# Patient Record
Sex: Male | Born: 1955 | Race: White | Hispanic: No | Marital: Married | Smoking: Current some day smoker
Health system: Southern US, Community
[De-identification: ages and names within clinical notes are randomized; demographics above are authoritative.]

## PROBLEM LIST (undated history)

## (undated) DIAGNOSIS — K219 Gastro-esophageal reflux disease without esophagitis: Secondary | ICD-10-CM

## (undated) DIAGNOSIS — R7301 Impaired fasting glucose: Secondary | ICD-10-CM

## (undated) DIAGNOSIS — R7989 Other specified abnormal findings of blood chemistry: Secondary | ICD-10-CM

## (undated) DIAGNOSIS — S39012A Strain of muscle, fascia and tendon of lower back, initial encounter: Secondary | ICD-10-CM

## (undated) DIAGNOSIS — R809 Proteinuria, unspecified: Secondary | ICD-10-CM

## (undated) DIAGNOSIS — R300 Dysuria: Secondary | ICD-10-CM

## (undated) DIAGNOSIS — I10 Essential (primary) hypertension: Secondary | ICD-10-CM

## (undated) DIAGNOSIS — R945 Abnormal results of liver function studies: Secondary | ICD-10-CM

## (undated) DIAGNOSIS — E785 Hyperlipidemia, unspecified: Secondary | ICD-10-CM

## (undated) DIAGNOSIS — K635 Polyp of colon: Secondary | ICD-10-CM

## (undated) DIAGNOSIS — R0683 Snoring: Secondary | ICD-10-CM

## (undated) DIAGNOSIS — Z720 Tobacco use: Secondary | ICD-10-CM

## (undated) DIAGNOSIS — L309 Dermatitis, unspecified: Secondary | ICD-10-CM

## (undated) DIAGNOSIS — M542 Cervicalgia: Secondary | ICD-10-CM

## (undated) DIAGNOSIS — R000 Tachycardia, unspecified: Secondary | ICD-10-CM

## (undated) DIAGNOSIS — E669 Obesity, unspecified: Secondary | ICD-10-CM

## (undated) DIAGNOSIS — M5030 Other cervical disc degeneration, unspecified cervical region: Secondary | ICD-10-CM

## (undated) DIAGNOSIS — M6530 Trigger finger, unspecified finger: Secondary | ICD-10-CM

## (undated) DIAGNOSIS — M2550 Pain in unspecified joint: Secondary | ICD-10-CM

## (undated) HISTORY — DX: Pain in unspecified joint: M25.50

## (undated) HISTORY — DX: Trigger finger, unspecified finger: M65.30

## (undated) HISTORY — DX: Impaired fasting glucose: R73.01

## (undated) HISTORY — DX: Other cervical disc degeneration, unspecified cervical region: M50.30

## (undated) HISTORY — DX: Abnormal results of liver function studies: R94.5

## (undated) HISTORY — DX: Hyperlipidemia, unspecified: E78.5

## (undated) HISTORY — DX: Snoring: R06.83

## (undated) HISTORY — PX: EYE SURGERY: SHX253

## (undated) HISTORY — DX: Dysuria: R30.0

## (undated) HISTORY — DX: Dermatitis, unspecified: L30.9

## (undated) HISTORY — DX: Tachycardia, unspecified: R00.0

## (undated) HISTORY — DX: Tobacco use: Z72.0

## (undated) HISTORY — DX: Other specified abnormal findings of blood chemistry: R79.89

## (undated) HISTORY — PX: OTHER SURGICAL HISTORY: SHX169

## (undated) HISTORY — DX: Gastro-esophageal reflux disease without esophagitis: K21.9

## (undated) HISTORY — DX: Polyp of colon: K63.5

## (undated) HISTORY — DX: Proteinuria, unspecified: R80.9

## (undated) HISTORY — DX: Cervicalgia: M54.2

## (undated) HISTORY — DX: Essential (primary) hypertension: I10

## (undated) HISTORY — DX: Obesity, unspecified: E66.9

## (undated) HISTORY — DX: Strain of muscle, fascia and tendon of lower back, initial encounter: S39.012A

---

## 1992-08-30 HISTORY — PX: LUMBAR LAMINECTOMY: SHX95

## 1999-06-17 ENCOUNTER — Encounter: Admission: RE | Admit: 1999-06-17 | Discharge: 1999-06-17 | Payer: Self-pay | Admitting: *Deleted

## 1999-06-17 ENCOUNTER — Encounter: Payer: Self-pay | Admitting: *Deleted

## 1999-10-08 ENCOUNTER — Ambulatory Visit (HOSPITAL_BASED_OUTPATIENT_CLINIC_OR_DEPARTMENT_OTHER): Admission: RE | Admit: 1999-10-08 | Discharge: 1999-10-08 | Payer: Self-pay | Admitting: Orthopedic Surgery

## 2004-11-11 ENCOUNTER — Ambulatory Visit: Payer: Self-pay | Admitting: Cardiology

## 2004-11-11 ENCOUNTER — Observation Stay (HOSPITAL_COMMUNITY): Admission: EM | Admit: 2004-11-11 | Discharge: 2004-11-12 | Payer: Self-pay | Admitting: Emergency Medicine

## 2004-11-12 ENCOUNTER — Ambulatory Visit: Payer: Self-pay | Admitting: Cardiology

## 2004-11-12 ENCOUNTER — Ambulatory Visit: Payer: Self-pay

## 2015-01-23 ENCOUNTER — Other Ambulatory Visit: Payer: Self-pay | Admitting: Internal Medicine

## 2015-01-23 DIAGNOSIS — Z720 Tobacco use: Secondary | ICD-10-CM

## 2016-01-13 DIAGNOSIS — H2511 Age-related nuclear cataract, right eye: Secondary | ICD-10-CM | POA: Diagnosis not present

## 2016-01-14 DIAGNOSIS — H2512 Age-related nuclear cataract, left eye: Secondary | ICD-10-CM | POA: Diagnosis not present

## 2016-01-15 DIAGNOSIS — Z0000 Encounter for general adult medical examination without abnormal findings: Secondary | ICD-10-CM | POA: Diagnosis not present

## 2016-01-15 DIAGNOSIS — Z125 Encounter for screening for malignant neoplasm of prostate: Secondary | ICD-10-CM | POA: Diagnosis not present

## 2016-01-15 DIAGNOSIS — R7301 Impaired fasting glucose: Secondary | ICD-10-CM | POA: Diagnosis not present

## 2016-01-15 DIAGNOSIS — E784 Other hyperlipidemia: Secondary | ICD-10-CM | POA: Diagnosis not present

## 2016-01-22 DIAGNOSIS — D126 Benign neoplasm of colon, unspecified: Secondary | ICD-10-CM | POA: Diagnosis not present

## 2016-01-22 DIAGNOSIS — R030 Elevated blood-pressure reading, without diagnosis of hypertension: Secondary | ICD-10-CM | POA: Diagnosis not present

## 2016-01-22 DIAGNOSIS — Z1389 Encounter for screening for other disorder: Secondary | ICD-10-CM | POA: Diagnosis not present

## 2016-01-22 DIAGNOSIS — Z0000 Encounter for general adult medical examination without abnormal findings: Secondary | ICD-10-CM | POA: Diagnosis not present

## 2016-01-27 DIAGNOSIS — H2512 Age-related nuclear cataract, left eye: Secondary | ICD-10-CM | POA: Diagnosis not present

## 2016-02-23 DIAGNOSIS — M9901 Segmental and somatic dysfunction of cervical region: Secondary | ICD-10-CM | POA: Diagnosis not present

## 2016-02-23 DIAGNOSIS — M5032 Other cervical disc degeneration, mid-cervical region, unspecified level: Secondary | ICD-10-CM | POA: Diagnosis not present

## 2016-02-23 DIAGNOSIS — M9902 Segmental and somatic dysfunction of thoracic region: Secondary | ICD-10-CM | POA: Diagnosis not present

## 2016-02-23 DIAGNOSIS — M531 Cervicobrachial syndrome: Secondary | ICD-10-CM | POA: Diagnosis not present

## 2016-03-01 DIAGNOSIS — H5005 Alternating esotropia: Secondary | ICD-10-CM | POA: Diagnosis not present

## 2016-06-14 DIAGNOSIS — M531 Cervicobrachial syndrome: Secondary | ICD-10-CM | POA: Diagnosis not present

## 2016-06-14 DIAGNOSIS — M9901 Segmental and somatic dysfunction of cervical region: Secondary | ICD-10-CM | POA: Diagnosis not present

## 2016-06-14 DIAGNOSIS — M9902 Segmental and somatic dysfunction of thoracic region: Secondary | ICD-10-CM | POA: Diagnosis not present

## 2016-06-14 DIAGNOSIS — M5032 Other cervical disc degeneration, mid-cervical region, unspecified level: Secondary | ICD-10-CM | POA: Diagnosis not present

## 2016-06-16 DIAGNOSIS — M5032 Other cervical disc degeneration, mid-cervical region, unspecified level: Secondary | ICD-10-CM | POA: Diagnosis not present

## 2016-06-16 DIAGNOSIS — M531 Cervicobrachial syndrome: Secondary | ICD-10-CM | POA: Diagnosis not present

## 2016-06-16 DIAGNOSIS — M9901 Segmental and somatic dysfunction of cervical region: Secondary | ICD-10-CM | POA: Diagnosis not present

## 2016-06-16 DIAGNOSIS — M9902 Segmental and somatic dysfunction of thoracic region: Secondary | ICD-10-CM | POA: Diagnosis not present

## 2016-07-01 DIAGNOSIS — D485 Neoplasm of uncertain behavior of skin: Secondary | ICD-10-CM | POA: Diagnosis not present

## 2016-07-01 DIAGNOSIS — M6748 Ganglion, other site: Secondary | ICD-10-CM | POA: Diagnosis not present

## 2016-07-01 DIAGNOSIS — L820 Inflamed seborrheic keratosis: Secondary | ICD-10-CM | POA: Diagnosis not present

## 2016-07-07 DIAGNOSIS — M6740 Ganglion, unspecified site: Secondary | ICD-10-CM | POA: Diagnosis not present

## 2016-07-07 DIAGNOSIS — M24542 Contracture, left hand: Secondary | ICD-10-CM | POA: Diagnosis not present

## 2017-02-16 DIAGNOSIS — R808 Other proteinuria: Secondary | ICD-10-CM | POA: Diagnosis not present

## 2017-02-16 DIAGNOSIS — E784 Other hyperlipidemia: Secondary | ICD-10-CM | POA: Diagnosis not present

## 2017-02-16 DIAGNOSIS — Z125 Encounter for screening for malignant neoplasm of prostate: Secondary | ICD-10-CM | POA: Diagnosis not present

## 2017-02-16 DIAGNOSIS — R7301 Impaired fasting glucose: Secondary | ICD-10-CM | POA: Diagnosis not present

## 2017-02-23 DIAGNOSIS — E668 Other obesity: Secondary | ICD-10-CM | POA: Diagnosis not present

## 2017-02-23 DIAGNOSIS — L0292 Furuncle, unspecified: Secondary | ICD-10-CM | POA: Diagnosis not present

## 2017-02-23 DIAGNOSIS — R300 Dysuria: Secondary | ICD-10-CM | POA: Diagnosis not present

## 2017-02-23 DIAGNOSIS — E784 Other hyperlipidemia: Secondary | ICD-10-CM | POA: Diagnosis not present

## 2017-02-23 DIAGNOSIS — Z0000 Encounter for general adult medical examination without abnormal findings: Secondary | ICD-10-CM | POA: Diagnosis not present

## 2017-02-23 DIAGNOSIS — Z1389 Encounter for screening for other disorder: Secondary | ICD-10-CM | POA: Diagnosis not present

## 2017-02-23 DIAGNOSIS — R000 Tachycardia, unspecified: Secondary | ICD-10-CM | POA: Diagnosis not present

## 2017-02-23 DIAGNOSIS — M542 Cervicalgia: Secondary | ICD-10-CM | POA: Diagnosis not present

## 2017-02-24 DIAGNOSIS — Z1212 Encounter for screening for malignant neoplasm of rectum: Secondary | ICD-10-CM | POA: Diagnosis not present

## 2017-06-22 DIAGNOSIS — M79642 Pain in left hand: Secondary | ICD-10-CM | POA: Insufficient documentation

## 2017-06-22 DIAGNOSIS — M65312 Trigger thumb, left thumb: Secondary | ICD-10-CM | POA: Insufficient documentation

## 2017-06-22 DIAGNOSIS — S66812A Strain of other specified muscles, fascia and tendons at wrist and hand level, left hand, initial encounter: Secondary | ICD-10-CM | POA: Insufficient documentation

## 2017-11-23 DIAGNOSIS — H26491 Other secondary cataract, right eye: Secondary | ICD-10-CM | POA: Diagnosis not present

## 2017-11-30 DIAGNOSIS — H26492 Other secondary cataract, left eye: Secondary | ICD-10-CM | POA: Diagnosis not present

## 2018-03-31 DIAGNOSIS — R7301 Impaired fasting glucose: Secondary | ICD-10-CM | POA: Diagnosis not present

## 2018-03-31 DIAGNOSIS — R82998 Other abnormal findings in urine: Secondary | ICD-10-CM | POA: Diagnosis not present

## 2018-03-31 DIAGNOSIS — Z0000 Encounter for general adult medical examination without abnormal findings: Secondary | ICD-10-CM | POA: Diagnosis not present

## 2018-03-31 DIAGNOSIS — Z125 Encounter for screening for malignant neoplasm of prostate: Secondary | ICD-10-CM | POA: Diagnosis not present

## 2018-03-31 DIAGNOSIS — E7849 Other hyperlipidemia: Secondary | ICD-10-CM | POA: Diagnosis not present

## 2018-03-31 DIAGNOSIS — R808 Other proteinuria: Secondary | ICD-10-CM | POA: Diagnosis not present

## 2018-04-07 DIAGNOSIS — Z0000 Encounter for general adult medical examination without abnormal findings: Secondary | ICD-10-CM | POA: Diagnosis not present

## 2018-04-07 DIAGNOSIS — Z1389 Encounter for screening for other disorder: Secondary | ICD-10-CM | POA: Diagnosis not present

## 2018-04-07 DIAGNOSIS — Z6830 Body mass index (BMI) 30.0-30.9, adult: Secondary | ICD-10-CM | POA: Diagnosis not present

## 2018-05-10 DIAGNOSIS — R030 Elevated blood-pressure reading, without diagnosis of hypertension: Secondary | ICD-10-CM | POA: Diagnosis not present

## 2018-05-10 DIAGNOSIS — Z6830 Body mass index (BMI) 30.0-30.9, adult: Secondary | ICD-10-CM | POA: Diagnosis not present

## 2018-05-10 DIAGNOSIS — T148XXA Other injury of unspecified body region, initial encounter: Secondary | ICD-10-CM | POA: Diagnosis not present

## 2018-10-02 DIAGNOSIS — H43391 Other vitreous opacities, right eye: Secondary | ICD-10-CM | POA: Diagnosis not present

## 2018-10-02 DIAGNOSIS — Z961 Presence of intraocular lens: Secondary | ICD-10-CM | POA: Diagnosis not present

## 2018-10-02 DIAGNOSIS — H43393 Other vitreous opacities, bilateral: Secondary | ICD-10-CM | POA: Diagnosis not present

## 2019-01-15 DIAGNOSIS — M180 Bilateral primary osteoarthritis of first carpometacarpal joints: Secondary | ICD-10-CM | POA: Diagnosis not present

## 2019-01-15 DIAGNOSIS — M79642 Pain in left hand: Secondary | ICD-10-CM | POA: Diagnosis not present

## 2019-01-15 DIAGNOSIS — M65332 Trigger finger, left middle finger: Secondary | ICD-10-CM | POA: Diagnosis not present

## 2019-01-15 DIAGNOSIS — M79641 Pain in right hand: Secondary | ICD-10-CM | POA: Diagnosis not present

## 2019-02-10 DIAGNOSIS — Z20828 Contact with and (suspected) exposure to other viral communicable diseases: Secondary | ICD-10-CM | POA: Diagnosis not present

## 2019-03-11 DIAGNOSIS — I639 Cerebral infarction, unspecified: Secondary | ICD-10-CM | POA: Diagnosis not present

## 2019-03-11 DIAGNOSIS — Z7982 Long term (current) use of aspirin: Secondary | ICD-10-CM | POA: Diagnosis not present

## 2019-03-11 DIAGNOSIS — R200 Anesthesia of skin: Secondary | ICD-10-CM | POA: Diagnosis not present

## 2019-03-11 DIAGNOSIS — F1010 Alcohol abuse, uncomplicated: Secondary | ICD-10-CM | POA: Insufficient documentation

## 2019-03-11 DIAGNOSIS — R42 Dizziness and giddiness: Secondary | ICD-10-CM | POA: Diagnosis not present

## 2019-03-11 DIAGNOSIS — I5189 Other ill-defined heart diseases: Secondary | ICD-10-CM | POA: Diagnosis not present

## 2019-03-11 DIAGNOSIS — I10 Essential (primary) hypertension: Secondary | ICD-10-CM | POA: Insufficient documentation

## 2019-03-11 DIAGNOSIS — F17210 Nicotine dependence, cigarettes, uncomplicated: Secondary | ICD-10-CM | POA: Diagnosis not present

## 2019-03-11 DIAGNOSIS — R202 Paresthesia of skin: Secondary | ICD-10-CM | POA: Diagnosis not present

## 2019-03-11 DIAGNOSIS — Z720 Tobacco use: Secondary | ICD-10-CM | POA: Insufficient documentation

## 2019-03-12 DIAGNOSIS — G459 Transient cerebral ischemic attack, unspecified: Secondary | ICD-10-CM | POA: Diagnosis not present

## 2019-03-12 DIAGNOSIS — I639 Cerebral infarction, unspecified: Secondary | ICD-10-CM | POA: Insufficient documentation

## 2019-03-14 DIAGNOSIS — F17200 Nicotine dependence, unspecified, uncomplicated: Secondary | ICD-10-CM | POA: Diagnosis not present

## 2019-03-14 DIAGNOSIS — I639 Cerebral infarction, unspecified: Secondary | ICD-10-CM | POA: Diagnosis not present

## 2019-03-14 DIAGNOSIS — I10 Essential (primary) hypertension: Secondary | ICD-10-CM | POA: Diagnosis not present

## 2019-03-14 DIAGNOSIS — E785 Hyperlipidemia, unspecified: Secondary | ICD-10-CM | POA: Diagnosis not present

## 2019-03-21 DIAGNOSIS — I639 Cerebral infarction, unspecified: Secondary | ICD-10-CM | POA: Diagnosis not present

## 2019-03-21 DIAGNOSIS — I10 Essential (primary) hypertension: Secondary | ICD-10-CM | POA: Diagnosis not present

## 2019-03-21 DIAGNOSIS — M5412 Radiculopathy, cervical region: Secondary | ICD-10-CM | POA: Diagnosis not present

## 2019-03-23 ENCOUNTER — Other Ambulatory Visit: Payer: Self-pay | Admitting: Internal Medicine

## 2019-03-23 DIAGNOSIS — I6381 Other cerebral infarction due to occlusion or stenosis of small artery: Secondary | ICD-10-CM

## 2019-03-27 ENCOUNTER — Ambulatory Visit
Admission: RE | Admit: 2019-03-27 | Discharge: 2019-03-27 | Disposition: A | Discharge status: Home / Self Care | Payer: BC Managed Care – PPO | Source: Ambulatory Visit | Attending: Internal Medicine | Admitting: Internal Medicine

## 2019-03-27 ENCOUNTER — Other Ambulatory Visit: Payer: Self-pay

## 2019-03-27 ENCOUNTER — Encounter: Payer: Self-pay | Admitting: Neurology

## 2019-03-27 ENCOUNTER — Ambulatory Visit: Payer: BC Managed Care – PPO | Admitting: Neurology

## 2019-03-27 VITALS — BP 160/95 | HR 77 | Temp 98.2°F | Ht 71.0 in | Wt 215.6 lb

## 2019-03-27 DIAGNOSIS — I6381 Other cerebral infarction due to occlusion or stenosis of small artery: Secondary | ICD-10-CM | POA: Diagnosis not present

## 2019-03-27 DIAGNOSIS — R202 Paresthesia of skin: Secondary | ICD-10-CM

## 2019-03-27 DIAGNOSIS — M542 Cervicalgia: Secondary | ICD-10-CM

## 2019-03-27 MED ORDER — CO-ENZYME Q-10 50 MG PO CAPS: 200.0000 mg | ORAL_CAPSULE | Freq: Every day | ORAL | 0 refills | Status: DC

## 2019-03-27 NOTE — Progress Notes (Signed)
[facility] [address] [place]. [place] [id number] [phone]       OFFICE CONSULT NOTE  [name] Date of Birth:  02/18/1956 Medical Record Number:  [id number]   Referring MD: Rodrigo RanMark Perini Reason for Referral: Stroke HPI: [name] is a 63 year old Caucasian male seen today for initial office consultation visit for stroke.  History is obtained from the patient, review of referral records and I personally reviewed imaging films in PACS.  Patient states [place] 8 when he developed sudden onset of numbness and heaviness in his left arm.  He did not think much of it as he could drive.  Next day felt better but when symptoms recurred he eventually called his physician who asked him to go to the emergency room on July 12 at [facility] [place].  He was admitted there for suspected stroke work-up.  CT scan showed a right basal ganglia 9 mm low-density thought to be lacunar infarct.  CT angiogram of the brain and neck was obtained which showed no significant large vessel intracranial or extracranial stenosis or occlusion.  MRI scan was obtained which showed no definite evidence of acute infarct.  Review of imaging films in PACS show questionable right parietal punctate diffusion hyperintensity however there is no corresponding images available in ADC map to see if this is acute or not.  The patient symptoms have persisted intermittently since then though they are getting better.  He has not had any numbness in his left leg.  He denied any accompanying symptoms in the form of headache, slurred speech, extremity weakness, gait or balance difficulties.  He has no prior history of strokes TIAs seizures migraines or significant neurological problems.  The patient was started on aspirin 81 mg as well as lisinopril for hypertension and fluvastatin.  Is tolerating these medications well without side effects but does  complain of muscle aches and pains and he does have a prior history of statin myalgias.  He on inquiry admits to having neck pain requiring chiropractic manipulation several times in the past.  He did complain of some neck pain and pain radiating down his left shoulder while he was [place] [facility] [place] [facility].  He has no family history of strokes or TIAs.  ROS:   14 system review of systems is positive for numbness, muscle aches, joint pain, fatigue, tiredness, neck pain all other systems negative PMH:  Past Medical History:  Diagnosis Date  . Back strain   . Colonic polyp   . DDD (degenerative disc disease), cervical   . Dysuria   . GERD (gastroesophageal reflux disease)   . Hyperlipidemia   . Neck pain   . Pain in joint, multiple sites   . Snoring   . Trigger finger   . White coat syndrome with hypertension     Social History:  Social History   Socioeconomic History  . Marital status: Married    Spouse name: Not on file  . Number of children: Not on file  . Years of education: Not on file  . Highest education level: Not on file  Occupational History  . Not on file  Social Needs  . Financial resource strain: Not on file  . Food insecurity    Worry: Not on file    Inability: Not on file  . Transportation needs    Medical: Not on file    Non-medical:  Not on file  Tobacco Use  . Smoking status: Current Every Day Smoker    Packs/day: 3.00    Types: Cigarettes  . Smokeless tobacco: Never Used  Substance and Sexual Activity  . Alcohol use: Yes    Alcohol/week: 2.0 standard drinks    Types: 2 Glasses of wine per week  . Drug use: Not Currently  . Sexual activity: Not on file  Lifestyle  . Physical activity    Days per week: Not on file    Minutes per session: Not on file  . Stress: Not on file  Relationships  . Social Musicianconnections    Talks on phone: Not on file    Gets together: Not on file    Attends religious service: Not on file    Active member of  club or organization: Not on file    Attends meetings of clubs or organizations: Not on file    Relationship status: Not on file  . Intimate partner violence    Fear of current or ex partner: Not on file    Emotionally abused: Not on file    Physically abused: Not on file    Forced sexual activity: Not on file  Other Topics Concern  . Not on file  Social History Narrative  . Not on file    Medications:   No current outpatient medications on file prior to visit.   No current facility-administered medications on file prior to visit.     Allergies:  Not on File  Physical Exam General: Overweight middle-aged Caucasian male, seated, in no evident distress Head: head normocephalic and atraumatic.   Neck: supple with no carotid or supraclavicular bruits Cardiovascular: regular rate and rhythm, no murmurs Musculoskeletal: no deformity Skin:  no rash/petichiae Vascular:  Normal pulses all extremities  Neurologic Exam Mental Status: Awake and fully alert. Oriented to place and time. Recent and remote memory intact. Attention span, concentration and fund of knowledge appropriate. Mood and affect appropriate.  Cranial Nerves: Fundoscopic exam reveals sharp disc margins. Pupils equal, briskly reactive to light. Extraocular movements full without nystagmus. Visual fields full to confrontation. Hearing intact. Facial sensation intact. Face, tongue, palate moves normally and symmetrically.  Motor: Normal bulk and tone. Normal strength in all tested extremity muscles. Sensory.: intact to touch , pinprick , position and vibratory sensation.  Subjective paresthesia in the left arm but no objective sensory loss Coordination: Rapid alternating movements normal in all extremities. Finger-to-nose and heel-to-shin performed accurately bilaterally. Gait and Station: Arises from chair without difficulty. Stance is normal. Gait demonstrates normal stride length and balance . Able to heel, toe and tandem  walk with slight difficulty.  Reflexes: 1+ and symmetric. Toes downgoing.   NIHSS  0 Modified Rankin  2   ASSESSMENT: 63 year old Caucasian male with transient left arm and leg paresthesias in July 2020 possibly from small right brain lacunar infarct not visualized on MRI x2.  Vascular risk factors of smoking, mild obesity, hypertension and hyperlipidemia   PLAN:  I had a long d/w patient about his recurrent transient left arm and leg paresthesias likely being from small lacunar infarct not visualized on MRI brain x2.  He however also has neck pain and compressive cervical spine disease is also consideration.  I discussed with him risk for recurrent stroke/TIAs, personally independently reviewed imaging studies and stroke evaluation results and answered questions.Continue aspirin 81 mg daily  for secondary stroke prevention and maintain strict control of hypertension with blood pressure goal below 130/90, diabetes  with hemoglobin A1c goal below 6.5% and lipids with LDL cholesterol goal below 70 mg/dL. I also advised the patient to eat a healthy diet with plenty of whole grains, cereals, fruits and vegetables, exercise regularly and maintain ideal body weight .he was advised to keep his appointment for scheduled MRI scan of the cervical spine and I will also order EMG nerve conduction study.  Advised him to take coenzyme Q 10 200 mg daily to help with his post statin myalgias.  Greater than 50% time during this 45-minute consultation visit was spent on counseling and coordination of care about his suspected lacunar stroke and evaluation and treatment for his paresthesias and answering questions followup in the future with me in 2 months or call earlier if necessary.  Antony Contras, MD  [facility] [address] [place], [place] [address]  Phone [phone] Fax [phone]  Note: This document was prepared with digital dictation and possible smart phrase  technology. Any transcriptional errors that result from this process are unintentional.

## 2019-03-27 NOTE — Patient Instructions (Signed)
I had a long d/w patient about his recurrent transient left arm and leg paresthesias likely being from small lacunar infarct not visualized on MRI brain x2.  He however also has neck pain and compressive cervical spine disease is also consideration.  I discussed with him risk for recurrent stroke/TIAs, personally independently reviewed imaging studies and stroke evaluation results and answered questions.Continue aspirin 81 mg daily  for secondary stroke prevention and maintain strict control of hypertension with blood pressure goal below 130/90, diabetes with hemoglobin A1c goal below 6.5% and lipids with LDL cholesterol goal below 70 mg/dL. I also advised the patient to eat a healthy diet with plenty of whole grains, cereals, fruits and vegetables, exercise regularly and maintain ideal body weight .he was advised to keep his appointment for scheduled MRI scan of the cervical spine and I will also order EMG nerve conduction study.  Advised him to take coenzyme every 10 200 mg daily to help with his post statin myalgias.  Followup in the future with me in 2 months or call earlier if necessary.   Stroke Prevention Some medical conditions and behaviors are associated with a higher chance of having a stroke. You can help prevent a stroke by making nutrition, lifestyle, and other changes, including managing any medical conditions you may have. What nutrition changes can be made?   Eat healthy foods. You can do this by: ? Choosing foods high in fiber, such as fresh fruits and vegetables and whole grains. ? Eating at least 5 or more servings of fruits and vegetables a day. Try to fill half of your plate at each meal with fruits and vegetables. ? Choosing lean protein foods, such as lean cuts of meat, poultry without skin, fish, tofu, beans, and nuts. ? Eating low-fat dairy products. ? Avoiding foods that are high in salt (sodium). This can help lower blood pressure. ? Avoiding foods that have saturated fat,  trans fat, and cholesterol. This can help prevent high cholesterol. ? Avoiding processed and premade foods.  Follow your health care provider's specific guidelines for losing weight, controlling high blood pressure (hypertension), lowering high cholesterol, and managing diabetes. These may include: ? Reducing your daily calorie intake. ? Limiting your daily sodium intake to 1,500 milligrams (mg). ? Using only healthy fats for cooking, such as olive oil, canola oil, or sunflower oil. ? Counting your daily carbohydrate intake. What lifestyle changes can be made?  Maintain a healthy weight. Talk to your health care provider about your ideal weight.  Get at least 30 minutes of moderate physical activity at least 5 days a week. Moderate activity includes brisk walking, biking, and swimming.  Do not use any products that contain nicotine or tobacco, such as cigarettes and e-cigarettes. If you need help quitting, ask your health care provider. It may also be helpful to avoid exposure to secondhand smoke.  Limit alcohol intake to no more than 1 drink a day for nonpregnant women and 2 drinks a day for men. One drink equals 12 oz of beer, 5 oz of wine, or 1 oz of hard liquor.  Stop any illegal drug use.  Avoid taking birth control pills. Talk to your health care provider about the risks of taking birth control pills if: ? You are over 58 years old. ? You smoke. ? You get migraines. ? You have ever had a blood clot. What other changes can be made?  Manage your cholesterol levels. ? Eating a healthy diet is important for preventing high cholesterol.  If cholesterol cannot be managed through diet alone, you may also need to take medicines. ? Take any prescribed medicines to control your cholesterol as told by your health care provider.  Manage your diabetes. ? Eating a healthy diet and exercising regularly are important parts of managing your blood sugar. If your blood sugar cannot be managed  through diet and exercise, you may need to take medicines. ? Take any prescribed medicines to control your diabetes as told by your health care provider.  Control your hypertension. ? To reduce your risk of stroke, try to keep your blood pressure below 130/80. ? Eating a healthy diet and exercising regularly are an important part of controlling your blood pressure. If your blood pressure cannot be managed through diet and exercise, you may need to take medicines. ? Take any prescribed medicines to control hypertension as told by your health care provider. ? Ask your health care provider if you should monitor your blood pressure at home. ? Have your blood pressure checked every year, even if your blood pressure is normal. Blood pressure increases with age and some medical conditions.  Get evaluated for sleep disorders (sleep apnea). Talk to your health care provider about getting a sleep evaluation if you snore a lot or have excessive sleepiness.  Take over-the-counter and prescription medicines only as told by your health care provider. Aspirin or blood thinners (antiplatelets or anticoagulants) may be recommended to reduce your risk of forming blood clots that can lead to stroke.  Make sure that any other medical conditions you have, such as atrial fibrillation or atherosclerosis, are managed. What are the warning signs of a stroke? The warning signs of a stroke can be easily remembered as BEFAST.  B is for balance. Signs include: ? Dizziness. ? Loss of balance or coordination. ? Sudden trouble walking.  E is for eyes. Signs include: ? A sudden change in vision. ? Trouble seeing.  F is for face. Signs include: ? Sudden weakness or numbness of the face. ? The face or eyelid drooping to one side.  A is for arms. Signs include: ? Sudden weakness or numbness of the arm, usually on one side of the body.  S is for speech. Signs include: ? Trouble speaking (aphasia). ? Trouble  understanding.  T is for time. ? These symptoms may represent a serious problem that is an emergency. Do not wait to see if the symptoms will go away. Get medical help right away. Call your local emergency services (911 in the [place] Do not drive yourself to the hospital.  Other signs of stroke may include: ? A sudden, severe headache with no known cause. ? Nausea or vomiting. ? Seizure. Where to find more information For more information, visit:  American Stroke Association: www.strokeassociation.org  National Stroke Association: www.stroke.org Summary  You can prevent a stroke by eating healthy, exercising, not smoking, limiting alcohol intake, and managing any medical conditions you may have.  Do not use any products that contain nicotine or tobacco, such as cigarettes and e-cigarettes. If you need help quitting, ask your health care provider. It may also be helpful to avoid exposure to secondhand smoke.  Remember BEFAST for warning signs of stroke. Get help right away if you or a loved one has any of these signs. This information is not intended to replace advice given to you by your health care provider. Make sure you discuss any questions you have with your health care provider. Document Released: 09/23/2004 Document Revised: 07/29/2017  Document Reviewed: 09/21/2016 Elsevier Patient Education  [employer].

## 2019-03-28 ENCOUNTER — Telehealth: Payer: Self-pay | Admitting: Neurology

## 2019-03-28 ENCOUNTER — Other Ambulatory Visit: Payer: Self-pay

## 2019-03-28 LAB — LIPID PANEL
Chol/HDL Ratio: 4.2 ratio (ref 0.0–5.0)
Cholesterol, Total: 169 mg/dL (ref 100–199)
HDL: 40 mg/dL (ref >39)
LDL Calculated: 80 mg/dL (ref 0–99)
Triglycerides: 246 mg/dL — ABNORMAL HIGH (ref 0–149)
VLDL Cholesterol Cal: 49 mg/dL — ABNORMAL HIGH (ref 5–40)

## 2019-03-28 LAB — HEMOGLOBIN A1C
Est. average glucose Bld gHb Est-mCnc: 123 mg/dL
Hgb A1c MFr Bld: 5.9 % — ABNORMAL HIGH (ref 4.8–5.6)

## 2019-03-28 MED ORDER — CO-ENZYME Q-10 50 MG PO CAPS: 200.0000 mg | ORAL_CAPSULE | Freq: Every day | ORAL | 0 refills | Status: DC

## 2019-03-28 NOTE — Telephone Encounter (Signed)
BCSB Auth: [id number] (exp. 03/28/19 to 09/23/19) order sent to GI. They will reach out to the patient to schedule.

## 2019-04-02 ENCOUNTER — Other Ambulatory Visit: Payer: Self-pay | Admitting: Neurology

## 2019-04-02 DIAGNOSIS — M5412 Radiculopathy, cervical region: Secondary | ICD-10-CM

## 2019-04-04 ENCOUNTER — Other Ambulatory Visit: Payer: Self-pay | Admitting: Internal Medicine

## 2019-04-04 DIAGNOSIS — I6381 Other cerebral infarction due to occlusion or stenosis of small artery: Secondary | ICD-10-CM

## 2019-04-05 ENCOUNTER — Telehealth: Payer: Self-pay | Admitting: Neurology

## 2019-04-05 NOTE — Telephone Encounter (Signed)
Pt has called asking for a call from RN to discuss his upcoming MRI and MRA

## 2019-04-05 NOTE — Telephone Encounter (Signed)
I called pt about his questions of the scans he is having on Friday and Sunday. He wanted to know why was another MR brain order. I stated the MR brain was order by Dr. Crist Infante not our office. I stated Dr. Leonie Man only order Mr cervical spine and Mr angio head. He verbalized understanding and will call his PCP office.

## 2019-04-06 ENCOUNTER — Ambulatory Visit
Admission: RE | Admit: 2019-04-06 | Discharge: 2019-04-06 | Disposition: A | Discharge status: Home / Self Care | Payer: BC Managed Care – PPO | Source: Ambulatory Visit | Attending: Neurology | Admitting: Neurology

## 2019-04-06 ENCOUNTER — Other Ambulatory Visit: Payer: BC Managed Care – PPO

## 2019-04-06 ENCOUNTER — Ambulatory Visit
Admission: RE | Admit: 2019-04-06 | Discharge: 2019-04-06 | Disposition: A | Discharge status: Home / Self Care | Payer: BC Managed Care – PPO | Source: Ambulatory Visit | Attending: Internal Medicine | Admitting: Internal Medicine

## 2019-04-06 ENCOUNTER — Other Ambulatory Visit: Payer: Self-pay

## 2019-04-06 DIAGNOSIS — I6381 Other cerebral infarction due to occlusion or stenosis of small artery: Secondary | ICD-10-CM

## 2019-04-06 DIAGNOSIS — M5412 Radiculopathy, cervical region: Secondary | ICD-10-CM

## 2019-04-06 DIAGNOSIS — M47812 Spondylosis without myelopathy or radiculopathy, cervical region: Secondary | ICD-10-CM | POA: Diagnosis not present

## 2019-04-06 DIAGNOSIS — M4302 Spondylolysis, cervical region: Secondary | ICD-10-CM | POA: Diagnosis not present

## 2019-04-06 DIAGNOSIS — M542 Cervicalgia: Secondary | ICD-10-CM | POA: Diagnosis not present

## 2019-04-06 DIAGNOSIS — I6389 Other cerebral infarction: Secondary | ICD-10-CM | POA: Diagnosis not present

## 2019-04-08 ENCOUNTER — Other Ambulatory Visit: Payer: BC Managed Care – PPO

## 2019-04-10 ENCOUNTER — Other Ambulatory Visit (HOSPITAL_COMMUNITY): Payer: Self-pay | Admitting: Neurology

## 2019-04-10 DIAGNOSIS — M5010 Cervical disc disorder with radiculopathy, unspecified cervical region: Secondary | ICD-10-CM

## 2019-04-11 ENCOUNTER — Telehealth: Payer: Self-pay

## 2019-04-11 NOTE — Telephone Encounter (Signed)
Notes recorded by Marval Regal, RN on 04/11/2019 at 12:11 PM EDT  I called pt that his diabetes was satisfactory. His bad cholesterol was high but acceptable. His triglycerides were elevated and he needs to see his PCP for further treatment. Pt has an appt in September with DR.Perni and will notify his nurse. I stated Dr. Leonie Man sent a copy of labs via epic to his PCP. Pt verbalized understanding.  ------

## 2019-04-11 NOTE — Telephone Encounter (Signed)
-----   Message from Garvin Fila, MD sent at 04/10/2019  8:00 AM EDT ----- Kindly inform the patient that screening blood work for diabetes was satisfactory.  Bad cholesterol was slightly high but acceptable.  Triglycerides were significantly elevated and advised him to see his primary physician Dr. Benn Moulder to consider treatment for this.

## 2019-05-02 DIAGNOSIS — R7301 Impaired fasting glucose: Secondary | ICD-10-CM | POA: Diagnosis not present

## 2019-05-02 DIAGNOSIS — Z0000 Encounter for general adult medical examination without abnormal findings: Secondary | ICD-10-CM | POA: Diagnosis not present

## 2019-05-02 DIAGNOSIS — I10 Essential (primary) hypertension: Secondary | ICD-10-CM | POA: Diagnosis not present

## 2019-05-02 DIAGNOSIS — Z23 Encounter for immunization: Secondary | ICD-10-CM | POA: Diagnosis not present

## 2019-05-02 DIAGNOSIS — Z125 Encounter for screening for malignant neoplasm of prostate: Secondary | ICD-10-CM | POA: Diagnosis not present

## 2019-05-02 DIAGNOSIS — E7849 Other hyperlipidemia: Secondary | ICD-10-CM | POA: Diagnosis not present

## 2019-05-04 DIAGNOSIS — R82998 Other abnormal findings in urine: Secondary | ICD-10-CM | POA: Diagnosis not present

## 2019-05-09 DIAGNOSIS — R0683 Snoring: Secondary | ICD-10-CM | POA: Diagnosis not present

## 2019-05-09 DIAGNOSIS — I10 Essential (primary) hypertension: Secondary | ICD-10-CM | POA: Diagnosis not present

## 2019-05-09 DIAGNOSIS — Z0000 Encounter for general adult medical examination without abnormal findings: Secondary | ICD-10-CM | POA: Diagnosis not present

## 2019-05-09 DIAGNOSIS — Z1331 Encounter for screening for depression: Secondary | ICD-10-CM | POA: Diagnosis not present

## 2019-05-09 DIAGNOSIS — M5412 Radiculopathy, cervical region: Secondary | ICD-10-CM | POA: Diagnosis not present

## 2019-05-09 DIAGNOSIS — M5030 Other cervical disc degeneration, unspecified cervical region: Secondary | ICD-10-CM | POA: Diagnosis not present

## 2019-05-15 ENCOUNTER — Telehealth: Payer: Self-pay | Admitting: Neurology

## 2019-05-15 ENCOUNTER — Encounter: Payer: BC Managed Care – PPO | Admitting: Neurology

## 2019-05-15 NOTE — Telephone Encounter (Signed)
I called patient to advise him of cancellation of NCV/EMG due to tech being out. I LVM requesting he call back to r/s.

## 2019-05-16 DIAGNOSIS — Z1211 Encounter for screening for malignant neoplasm of colon: Secondary | ICD-10-CM | POA: Diagnosis not present

## 2019-05-17 DIAGNOSIS — Z1212 Encounter for screening for malignant neoplasm of rectum: Secondary | ICD-10-CM | POA: Diagnosis not present

## 2019-05-22 DIAGNOSIS — D123 Benign neoplasm of transverse colon: Secondary | ICD-10-CM | POA: Diagnosis not present

## 2019-05-22 DIAGNOSIS — K635 Polyp of colon: Secondary | ICD-10-CM | POA: Diagnosis not present

## 2019-05-22 DIAGNOSIS — D124 Benign neoplasm of descending colon: Secondary | ICD-10-CM | POA: Diagnosis not present

## 2019-05-22 DIAGNOSIS — K5730 Diverticulosis of large intestine without perforation or abscess without bleeding: Secondary | ICD-10-CM | POA: Diagnosis not present

## 2019-05-22 DIAGNOSIS — Z1211 Encounter for screening for malignant neoplasm of colon: Secondary | ICD-10-CM | POA: Diagnosis not present

## 2019-06-01 DIAGNOSIS — Z6831 Body mass index (BMI) 31.0-31.9, adult: Secondary | ICD-10-CM | POA: Diagnosis not present

## 2019-06-01 DIAGNOSIS — I10 Essential (primary) hypertension: Secondary | ICD-10-CM | POA: Diagnosis not present

## 2019-06-01 DIAGNOSIS — M47812 Spondylosis without myelopathy or radiculopathy, cervical region: Secondary | ICD-10-CM | POA: Diagnosis not present

## 2019-08-11 DIAGNOSIS — Z20828 Contact with and (suspected) exposure to other viral communicable diseases: Secondary | ICD-10-CM | POA: Diagnosis not present

## 2020-05-01 DIAGNOSIS — Z0000 Encounter for general adult medical examination without abnormal findings: Secondary | ICD-10-CM | POA: Diagnosis not present

## 2020-05-01 DIAGNOSIS — Z125 Encounter for screening for malignant neoplasm of prostate: Secondary | ICD-10-CM | POA: Diagnosis not present

## 2020-05-01 DIAGNOSIS — E785 Hyperlipidemia, unspecified: Secondary | ICD-10-CM | POA: Diagnosis not present

## 2020-05-01 DIAGNOSIS — R7301 Impaired fasting glucose: Secondary | ICD-10-CM | POA: Diagnosis not present

## 2020-05-13 DIAGNOSIS — Z0000 Encounter for general adult medical examination without abnormal findings: Secondary | ICD-10-CM | POA: Diagnosis not present

## 2020-05-13 DIAGNOSIS — R82998 Other abnormal findings in urine: Secondary | ICD-10-CM | POA: Diagnosis not present

## 2020-05-13 DIAGNOSIS — I10 Essential (primary) hypertension: Secondary | ICD-10-CM | POA: Diagnosis not present

## 2020-05-13 DIAGNOSIS — E785 Hyperlipidemia, unspecified: Secondary | ICD-10-CM | POA: Diagnosis not present

## 2020-05-13 DIAGNOSIS — Z1331 Encounter for screening for depression: Secondary | ICD-10-CM | POA: Diagnosis not present

## 2020-05-28 ENCOUNTER — Other Ambulatory Visit: Payer: Self-pay | Admitting: Internal Medicine

## 2020-05-28 DIAGNOSIS — Z720 Tobacco use: Secondary | ICD-10-CM

## 2020-06-12 ENCOUNTER — Ambulatory Visit
Admission: RE | Admit: 2020-06-12 | Discharge: 2020-06-12 | Disposition: A | Discharge status: Home / Self Care | Payer: No Typology Code available for payment source | Source: Ambulatory Visit | Attending: Internal Medicine | Admitting: Internal Medicine

## 2020-06-12 DIAGNOSIS — I10 Essential (primary) hypertension: Secondary | ICD-10-CM | POA: Diagnosis not present

## 2020-06-12 DIAGNOSIS — Z720 Tobacco use: Secondary | ICD-10-CM

## 2020-06-24 ENCOUNTER — Ambulatory Visit: Payer: BC Managed Care – PPO | Admitting: Cardiology

## 2020-06-24 ENCOUNTER — Other Ambulatory Visit: Payer: Self-pay

## 2020-06-24 ENCOUNTER — Encounter: Payer: Self-pay | Admitting: Cardiology

## 2020-06-24 VITALS — BP 144/92 | HR 101 | Ht 71.0 in | Wt 213.2 lb

## 2020-06-24 DIAGNOSIS — E785 Hyperlipidemia, unspecified: Secondary | ICD-10-CM

## 2020-06-24 DIAGNOSIS — I2510 Atherosclerotic heart disease of native coronary artery without angina pectoris: Secondary | ICD-10-CM | POA: Diagnosis not present

## 2020-06-24 MED ORDER — ROSUVASTATIN CALCIUM 10 MG PO TABS: 10.0000 mg | ORAL_TABLET | Freq: Every day | ORAL | 3 refills | Status: DC

## 2020-06-24 NOTE — Patient Instructions (Addendum)
Medication Instructions:  Start Rosuvastatin 10 mg daily Stop Fluvastatin   *If you need a refill on your cardiac medications before your next appointment, please call your pharmacy*  Lab Work: None ordered.  If you have labs (blood work) drawn today and your tests are completely normal, you will receive your results only by: Marland Kitchen MyChart Message (if you have MyChart) OR . A paper copy in the mail If you have any lab test that is abnormal or we need to change your treatment, we will call you to review the results.  Testing/Procedures: None ordered.  Follow-Up: At [facility], you and your health needs are our priority.  As part of our continuing mission to provide you with exceptional heart care, we have created designated Provider Care Teams.  These Care Teams include your primary Cardiologist (physician) and Advanced Practice Providers (APPs -  Physician Assistants and Nurse Practitioners) who all work together to provide you with the care you need, when you need it.  We recommend signing up for the patient portal called "MyChart".  Sign up information is provided on this After Visit Summary.  MyChart is used to connect with patients for Virtual Visits (Telemedicine).  Patients are able to view lab/test results, encounter notes, upcoming appointments, etc.  Non-urgent messages can be sent to your provider as well.   To learn more about what you can do with MyChart, go to [email or web address].    Your next appointment:   Your physician wants you to follow-up in: 09/24/20 at 4 pm with Dr. Lalla Brothers.     Other Instructions:

## 2020-06-24 NOTE — Progress Notes (Signed)
Electrophysiology Office Note:    Date:  06/24/2020   ID:  Julian Sullivan, DOB 06-28-1956, MRN [id number]  PCP:  Rodrigo Ran, MD  [facility] Cardiologist:  No primary care provider on file.  [facility] Electrophysiologist:  None   Referring MD: Rodrigo Ran, MD   Chief Complaint: Elevated coronary artery calcium score  History of Present Illness:    Julian Sullivan is a 64 y.o. male who presents for an evaluation of coronary artery disease at the request of Dr. Sherrye Payor. Their medical history includes hyperlipidemia, tobacco use, alcohol use.  He tells me that a coronary artery calcium score was ordered as part of a routine physical evaluation by his primary care physician.  Results showed an elevated coronary artery calcium score.  He denies any chest pain with exertion.  No limitation in his exercise capacity.  He is a fairly active man working in the [employer].  He walks up and down large warehouses without significant limitations.  He is recently stopped smoking cigarettes (Friday of last week) in part triggered by the results of his coronary artery calcium score.  He is vaping to help manage the urges for nicotine.  He tells me that he has tried several statins in the past including Lipitor but these caused myalgias.  He has never tried rosuvastatin.  Past Medical History:  Diagnosis Date  . Abnormal LFTs   . Back strain   . Cervicalgia   . Colon polyps   . Colonic polyp   . DDD (degenerative disc disease), cervical   . Dermatitis   . Dysuria   . Dysuria   . GERD (gastroesophageal reflux disease)   . Hyperlipidemia   . IFG (impaired fasting glucose)   . Microalbuminuria   . Neck pain   . Neck pain   . Obesity   . Pain in joint, multiple sites   . Proteinuria   . Snoring   . Snoring   . Tachycardia   . Tobacco abuse   . Trigger finger   . White coat syndrome with hypertension     Past Surgical History:  Procedure Laterality Date  . EYE SURGERY      lower eyelids not working   . LUMBAR LAMINECTOMY  1994  . mixoid cyst off finger       Current Medications: Current Meds  Medication Sig  . Acetaminophen (TYLENOL PO) Take by mouth.  . ASPIRIN LOW DOSE 81 MG chewable tablet CSW 1 T PO WITH BRE  . buPROPion (WELLBUTRIN XL) 150 MG 24 hr tablet Take by mouth.  . cetirizine (ZYRTEC) 10 MG tablet Take 10 mg by mouth daily.  . Cholecalciferol (VITAMIN D3) 125 MCG (5000 UT) TABS Take by mouth.  Marland Kitchen lisinopril (ZESTRIL) 10 MG tablet Take by mouth.  . [DISCONTINUED] fluvastatin (LESCOL) 20 MG capsule Take 20 mg by mouth at bedtime.     Allergies:   Fluvastatin and Percocet [oxycodone-acetaminophen]   Social History   Socioeconomic History  . Marital status: Married    Spouse name: Not on file  . Number of children: Not on file  . Years of education: Not on file  . Highest education level: Not on file  Occupational History  . Not on file  Tobacco Use  . Smoking status: Current Every Day Smoker    Packs/day: 0.25    Types: Cigarettes  . Smokeless tobacco: Never Used  Vaping Use  . Vaping Use: Every day  Substance and Sexual Activity  .  Alcohol use: Yes    Alcohol/week: 2.0 standard drinks    Types: 2 Glasses of wine per week  . Drug use: Not Currently  . Sexual activity: Not on file  Other Topics Concern  . Not on file  Social History Narrative  . Not on file   Social Determinants of Health   Financial Resource Strain:   . Difficulty of Paying Living Expenses: Not on file  Food Insecurity:   . Worried About Programme researcher, broadcasting/film/video in the Last Year: Not on file  . Ran Out of Food in the Last Year: Not on file  Transportation Needs:   . Lack of Transportation (Medical): Not on file  . Lack of Transportation (Non-Medical): Not on file  Physical Activity:   . Days of Exercise per Week: Not on file  . Minutes of Exercise per Session: Not on file  Stress:   . Feeling of Stress : Not on file  Social Connections:   .  Frequency of Communication with Friends and Family: Not on file  . Frequency of Social Gatherings with Friends and Family: Not on file  . Attends Religious Services: Not on file  . Active Member of Clubs or Organizations: Not on file  . Attends Banker Meetings: Not on file  . Marital Status: Not on file     Family History: The patient's family history includes Heart attack in his father.  ROS:   Please see the history of present illness.    All other systems reviewed and are negative.  EKGs/Labs/Other Studies Reviewed:    The following studies were reviewed today: Outside records  EKG:  The ekg ordered today demonstrates sinus rhythm.  Coronary artery calcium score showed a score of 895 putting him at the 93rd percentile for age.  Recent Labs: No results found for requested labs within last 8760 hours.  Recent Lipid Panel    Component Value Date/Time   CHOL 169 03/27/2019 1658   TRIG 246 (H) 03/27/2019 1658   HDL 40 03/27/2019 1658   CHOLHDL 4.2 03/27/2019 1658   LDLCALC 80 03/27/2019 1658    Physical Exam:    VS:  BP (!) 144/92   Pulse (!) 101   Ht 5\' 11"  (1.803 m)   Wt 213 lb 3.2 oz (96.7 kg)   SpO2 98%   BMI 29.74 kg/m     Wt Readings from Last 3 Encounters:  06/24/20 213 lb 3.2 oz (96.7 kg)  03/27/19 215 lb 9.6 oz (97.8 kg)     GEN:  Well nourished, well developed in no acute distress HEENT: Normal NECK: No JVD; No carotid bruits LYMPHATICS: No lymphadenopathy CARDIAC: RRR, no murmurs, rubs, gallops RESPIRATORY:  Clear to auscultation without rales, wheezing or rhonchi  ABDOMEN: Soft, non-tender, non-distended MUSCULOSKELETAL:  No edema; No deformity  SKIN: Warm and dry NEUROLOGIC:  Alert and oriented x 3 PSYCHIATRIC:  Normal affect   ASSESSMENT:    1. Coronary artery disease involving native heart, unspecified vessel or lesion type, unspecified whether angina present   2. Hyperlipidemia, unspecified hyperlipidemia type    PLAN:      In order of problems listed above:  1. Coronary artery disease Evidenced by elevated coronary artery calcium score.  Completely asymptomatic.  I have recommended that we try a high intensity statin.  Will use rosuvastatin 10 mg once daily.  If he develops myalgias he will let 03/29/19 know.  I have also put in a referral to our lipid clinic  to discuss whether or not he is a candidate for Repatha. I have also encouraged him to pursue a Mediterranean diet and continue abstaining from tobacco use.  He will also work on moderating his alcohol use.  I like to see him back in 3 months after he has had a chance to meet with our lipid clinic and use rosuvastatin.  2.  Hyperlipidemia Rosuvastatin as above.  Stop fluvastatin today.  Lipid clinic referral.   Medication Adjustments/Labs and Tests Ordered: Current medicines are reviewed at length with the patient today.  Concerns regarding medicines are outlined above.  Orders Placed This Encounter  Procedures  . AMB Referral to [facility] Pharm[facility]  . EKG 12-Lead   Meds ordered this encounter  Medications  . rosuvastatin (CRESTOR) 10 MG tablet    Sig: Take 1 tablet (10 mg total) by mouth daily.    Dispense:  90 tablet    Refill:  3     Signed, Steffanie Dunn, MD, [facility]  06/24/2020 5:07 PM    Electrophysiology [facility]

## 2020-07-01 ENCOUNTER — Other Ambulatory Visit: Payer: Self-pay

## 2020-07-01 ENCOUNTER — Ambulatory Visit (INDEPENDENT_AMBULATORY_CARE_PROVIDER_SITE_OTHER): Payer: BC Managed Care – PPO | Admitting: Pharmacist

## 2020-07-01 DIAGNOSIS — I2510 Atherosclerotic heart disease of native coronary artery without angina pectoris: Secondary | ICD-10-CM | POA: Insufficient documentation

## 2020-07-01 DIAGNOSIS — R7303 Prediabetes: Secondary | ICD-10-CM | POA: Insufficient documentation

## 2020-07-01 DIAGNOSIS — M791 Myalgia, unspecified site: Secondary | ICD-10-CM | POA: Diagnosis not present

## 2020-07-01 DIAGNOSIS — R200 Anesthesia of skin: Secondary | ICD-10-CM | POA: Insufficient documentation

## 2020-07-01 DIAGNOSIS — E785 Hyperlipidemia, unspecified: Secondary | ICD-10-CM

## 2020-07-01 DIAGNOSIS — T466X5A Adverse effect of antihyperlipidemic and antiarteriosclerotic drugs, initial encounter: Secondary | ICD-10-CM

## 2020-07-01 NOTE — Patient Instructions (Addendum)
It was good meeting you today!  I want you to continue your rosuvastatin 10 mg for one more week before increasing it to 20 mg unless you are having side effects.  Continue to eat a heart healthy diet of unsaturated fats (Mediterranean Diet)  Try to increase your physical activity up to 30 minutes five days a week   Laural Golden, PharmD, BCACP, CDCES [facility] [address] [place], [place] [address] Phone: [phone]; Fax: [phone] 07/01/2020 5:01 PM

## 2020-07-01 NOTE — Progress Notes (Signed)
Patient ID: Julian Sullivan                 DOB: [birth year]                    MRN: [id number]     HPI: Julian Sullivan is a 64 y.o. male patient referred to lipid clinic by Dr Lalla Brothers. PMH is significant for CVA, HTN, EtOH abuse, pre diabetes and tobacco abuse.  Patient diagnosed with CVA in July 2020 and was discharged on aspirin, atorvastatin 40mg , and lisinopril. Patient reported myalgia with statin and was switched to fluvastatin.  Patient seen by Dr after coronary calcium test revealed calcium score of 895 which placed him at 93rd percentile for his age range. Reported intolerances to statins but had not tried rosuvastatin.  Dr Lalla Brothers started rosuvastatin 10mg  and referred to lipid clinic.  Patient presents today in good spirits and with many questions regarding his CT Cardiac Score.  Does not know if it is accurate.  Has been on rosuvastatin 10mg  for 1 week and is tolerating well.  Has not had a cigarette in 10 days but is using a vape pen to try to reduce cravings.  Drinks 2-3 alcoholic drinks per night.  Does not have a formal exercise routine but is physically active at work.  Has been trying to incorporate more fish, vegetables, and unsaturated fats into diet.  Reports he had a lipid panel updated in September 2021 (can not find on [facility] or care everywhere) and believes LDL was <100.  Will try to send it to .  Note: patient reports he did NOT have a CVA last year and was misdiagnosed.  Reports no chest pain or SOB.  Current Medications: rosuvastatin 10mg  daily Intolerances: fluvastatin 20mg , atorvastatin 40mg  Risk Factors: History of CVA, smoking, HTN LDL goal: <70  Family History: father passed away at 53 (myocardial infarctions)  Social History: Has not had cigarette in 10 days 10/22.  Still drinking 2-3 drinks a night.    Labs: HDL 43, LDL 106, Trigs 89, VLDL 49, TC 167 (05/02/19)  Past Medical History:  Diagnosis Date  . Abnormal LFTs   . Back strain   . Cervicalgia    . Colon polyps   . Colonic polyp   . DDD (degenerative disc disease), cervical   . Dermatitis   . Dysuria   . Dysuria   . GERD (gastroesophageal reflux disease)   . Hyperlipidemia   . IFG (impaired fasting glucose)   . Microalbuminuria   . Neck pain   . Neck pain   . Obesity   . Pain in joint, multiple sites   . Proteinuria   . Snoring   . Snoring   . Tachycardia   . Tobacco abuse   . Trigger finger   . White coat syndrome with hypertension     Current Outpatient Medications on File Prior to Visit  Medication Sig Dispense Refill  . Acetaminophen (TYLENOL PO) Take by mouth.    . ASPIRIN LOW DOSE 81 MG chewable tablet CSW 1 T PO WITH BRE    . buPROPion (WELLBUTRIN XL) 150 MG 24 hr tablet Take by mouth.    . cetirizine (ZYRTEC) 10 MG tablet Take 10 mg by mouth daily.    . Cholecalciferol (VITAMIN D3) 125 MCG (5000 UT) TABS Take by mouth.    lisinopril (ZESTRIL) 10 MG tablet Take by mouth.    . rosuvastatin (CRESTOR) 10 MG tablet Take 1 tablet (  10 mg total) by mouth daily. 90 tablet 3   No current facility-administered medications on file prior to visit.    Allergies  Allergen Reactions  . Fluvastatin   . Percocet [Oxycodone-Acetaminophen]     Assessment/Plan:  1. Hyperlipidemia - Patient LDL from 2020 was 106, unclear what LDL is this year.  Patient will send updated lipid panel.  Discussed atherosclerosis, plaque buildup, and importance of prevention to reduce CV risks.  Patient is willing to be aggressive in treatment but is also interested in more information regarding his risk level. Order placed for advanced lipid panel and patient scheduled.  He is concerned about being misdiagnosed due to being told he had a stroke last Summer when he did not.  Recommended patient continue to eat a heart healthy diet, low in saturated fats, and high in unsaturated fats and vegetables.  Recommended increasing physical activity to at least 30 minutes a day at least 5 days a week.   Will titrate up rosuvastatin to 20 mg in 1 week if patient is still tolerating.    Laural Golden, PharmD, BCACP, CDCES [facility] [address] [place], [place] [address] Phone: [phone]; Fax: [phone] 07/01/2020 5:37 PM

## 2020-07-04 ENCOUNTER — Other Ambulatory Visit: Payer: Self-pay

## 2020-07-04 ENCOUNTER — Other Ambulatory Visit: Payer: BC Managed Care – PPO | Admitting: *Deleted

## 2020-07-04 DIAGNOSIS — I2510 Atherosclerotic heart disease of native coronary artery without angina pectoris: Secondary | ICD-10-CM | POA: Diagnosis not present

## 2020-07-04 DIAGNOSIS — E785 Hyperlipidemia, unspecified: Secondary | ICD-10-CM | POA: Diagnosis not present

## 2020-07-05 LAB — NMR, LIPOPROFILE
Cholesterol, Total: 138 mg/dL (ref 100–199)
HDL Particle Number: 39.5 umol/L (ref >=30.5)
HDL-C: 56 mg/dL (ref >39)
LDL Particle Number: 624 nmol/L (ref <1000)
LDL Size: 20.6 nm (ref >20.5)
LDL-C (NIH Calc): 61 mg/dL (ref 0–99)
LP-IR Score: 45 (ref <=45)
Small LDL Particle Number: 334 nmol/L (ref <=527)
Triglycerides: 116 mg/dL (ref 0–149)

## 2020-07-05 LAB — LIPOPROTEIN A (LPA): Lipoprotein (a): 11.2 nmol/L (ref <75.0)

## 2020-07-05 LAB — APOLIPOPROTEIN B: Apolipoprotein B: 65 mg/dL (ref <90)

## 2020-07-18 DIAGNOSIS — Z1212 Encounter for screening for malignant neoplasm of rectum: Secondary | ICD-10-CM | POA: Diagnosis not present

## 2020-07-22 ENCOUNTER — Institutional Professional Consult (permissible substitution): Payer: BC Managed Care – PPO | Admitting: Pulmonary Disease

## 2020-08-06 ENCOUNTER — Telehealth: Payer: Self-pay | Admitting: Pharmacist

## 2020-08-06 DIAGNOSIS — E785 Hyperlipidemia, unspecified: Secondary | ICD-10-CM

## 2020-08-06 MED ORDER — ROSUVASTATIN CALCIUM 20 MG PO TABS: 20.0000 mg | ORAL_TABLET | Freq: Every day | ORAL | 1 refills | Status: DC

## 2020-08-06 NOTE — Telephone Encounter (Signed)
Patient messaged that he has been tolerating Crestor 10mg  x 2 tablets.  Will send in Rx for 20mg .

## 2020-08-18 ENCOUNTER — Encounter: Payer: Self-pay | Admitting: Pulmonary Disease

## 2020-08-18 ENCOUNTER — Ambulatory Visit: Payer: BC Managed Care – PPO | Admitting: Pulmonary Disease

## 2020-08-18 ENCOUNTER — Other Ambulatory Visit: Payer: Self-pay

## 2020-08-18 VITALS — BP 126/70 | HR 90 | Temp 97.3°F | Ht 71.0 in | Wt 210.2 lb

## 2020-08-18 DIAGNOSIS — R942 Abnormal results of pulmonary function studies: Secondary | ICD-10-CM | POA: Diagnosis not present

## 2020-08-18 DIAGNOSIS — Z720 Tobacco use: Secondary | ICD-10-CM

## 2020-08-18 NOTE — Progress Notes (Signed)
Subjective:   PATIENT ID: Julian Sullivan GENDER: male DOB: 04/27/1956, MRN: [id number]   HPI  Chief Complaint  Patient presents with  . Consult    Here for consult.  Had CT chest done.     Reason for Visit: New consult for abnormal CT  Mr. Atharva Mirsky is a 64 year old male with HTN, HLD and CAD who presents for abnormal CT Chest.  His PCP recently ordered coronary calcium CT as part of his routine evaluation and was referred to Cardiology for abnormal CAC score and was started on high intensity statin. His CT also demonstrated suggestive of fibrosis so he was referred to Pulmonary for evaluation.  He reports that he Is an active male with no limitations in activity. Denies shortness of breath, cough, wheezing. No pleuritic chest pain, chest tightness. 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 Quit 2 months ago. Currently vaping with flavors Prior notes mention etOH use 2-3 drinks daily  I have personally reviewed patient's past medical/family/social history, allergies, current medications.  Past Medical History:  Diagnosis Date  . Abnormal LFTs   . Back strain   . Cervicalgia   . Colon polyps   . Colonic polyp   . DDD (degenerative disc disease), cervical   . Dermatitis   . Dysuria   . Dysuria   . GERD (gastroesophageal reflux disease)   . Hyperlipidemia   . IFG (impaired fasting glucose)   . Microalbuminuria   . Neck pain   . Neck pain   . Obesity   . Pain in joint, multiple sites   . Proteinuria   . Snoring   . Snoring   . Tachycardia   . Tobacco abuse   . Trigger finger   . White coat syndrome with hypertension      Family History  Problem Relation Age of Onset  . Heart attack Father      Social History   Occupational History  . Not on  file  Tobacco Use  . Smoking status: Former Smoker    Packs/day: 0.50    Types: Cigarettes    Quit date: 06/20/2020    Years since quitting: 0.1  . Smokeless tobacco: Never Used  Vaping Use  . Vaping Use: Every day  Substance and Sexual Activity  . Alcohol use: Yes    Alcohol/week: 2.0 standard drinks    Types: 2 Glasses of wine per week  . Drug use: Not Currently  . Sexual activity: Not on file    Allergies  Allergen Reactions  . Fluvastatin   . Percocet [Oxycodone-Acetaminophen] Other (See Comments)    Severe dreams     Outpatient Medications Prior to Visit  Medication Sig Dispense Refill  . Acetaminophen (TYLENOL PO) Take by mouth.    . ASPIRIN LOW DOSE 81 MG chewable tablet Chew 81 mg by mouth daily.     Marland Kitchen buPROPion (WELLBUTRIN XL) 150 MG 24 hr tablet Take by mouth.    . cetirizine (ZYRTEC) 10 MG tablet Take 10 mg by mouth daily.    . Cholecalciferol (VITAMIN D3) 125 MCG (5000 UT) TABS Take by mouth.    Marland Kitchen lisinopril (ZESTRIL) 10 MG tablet Take by mouth.    . rosuvastatin (CRESTOR) 20 MG tablet Take 1 tablet (20 mg total) by mouth daily. 90 tablet 1  No facility-administered medications prior to visit.    Review of Systems  Constitutional: Negative for chills, diaphoresis, fever, malaise/fatigue and weight loss.  HENT: Negative for congestion, ear pain and sore throat.   Respiratory: Negative for cough, hemoptysis, sputum production, shortness of breath and wheezing.   Cardiovascular: Negative for chest pain, palpitations and leg swelling.  Gastrointestinal: Negative for abdominal pain, heartburn and nausea.  Genitourinary: Negative for frequency.  Musculoskeletal: Negative for joint pain and myalgias.  Skin: Negative for itching and rash.  Neurological: Negative for dizziness, weakness and headaches.  Endo/Heme/Allergies: Does not bruise/bleed easily.  Psychiatric/Behavioral: Negative for depression. The patient is not nervous/anxious.      Objective:    Vitals:   08/18/20 1525  BP: 126/70  Pulse: 90  Temp: (!) 97.3 F (36.3 C)  TempSrc: Tympanic  SpO2: 99%  Weight: 210 lb 4 oz (95.4 kg)  Height: 5\' 11"  (1.803 m)   SpO2: 99 %  Physical Exam: General: Well-appearing, no acute distress HENT: [place], AT Eyes: EOMI, no scleral icterus Respiratory: Clear to auscultation bilaterally.  No crackles, wheezing or rales Cardiovascular: RRR, -M/R/G, no JVD Extremities:-Edema,-tenderness Neuro: AAO x4, CNII-XII grossly intact Skin: Intact, no rashes or bruising Psych: Normal mood, normal affect  Data Reviewed:  Imaging: CT Cardiac Scoring 06/12/20 - Diffuse subpleural reticulation with groundglass with prominent areas of interstitial thickening noted in the left anterior upper lung and right lung base.  PFT: None on file  Imaging, labs and test noted above have been reviewed independently by me.    Assessment & Plan:   Discussion: 64 year old male active vaper with incidental CT chest findings during routine cardiac CT. Asymptomatic.  Abnormal CT scan concerning for fibrosis --Will arrange for pulmonary function tests (PFTs) --Labs ordered: ANA, CCP, RF to rule out autoimmune disease --If abnormal, will consider dedicated high resolution CT to evaluate lung parenchyma.  --If normal, annual pulmonary function test and routine CT chest if needed  Tobacco Abuse --STOP smoking/vaping  Follow up with me after PFTs  Health Maintenance Immunization History  Administered Date(s) Administered  . PFIZER SARS-COV-2 Vaccination 04/10/2020, 05/07/2020   CT Lung Screen - no  Orders Placed This Encounter  Procedures  . ANA    Standing Status:   Future    Number of Occurrences:   1    Standing Expiration Date:   08/18/2021  . Cyclic citrul peptide antibody, IgG    Standing Status:   Future    Number of Occurrences:   1    Standing Expiration Date:   08/18/2021  . Rheumatoid factor    Standing Status:   Future    Number of  Occurrences:   1    Standing Expiration Date:   08/18/2021  . Pulmonary function test    Standing Status:   Future    Standing Expiration Date:   08/18/2021    Scheduling Instructions:     Pt will need afternoon only and follow up with Dr. 08/20/2021 after.    Order Specific Question:   Where should this test be performed?    Answer:   [facility]    Order Specific Question:   Full PFT: includes the following: basic spirometry, spirometry pre & post bronchodilator, diffusion capacity (DLCO), lung volumes    Answer:   Full PFT    Order Specific Question:   MIP/MEP    Answer:   No    Order Specific Question:   6 minute walk  Answer:   No    Order Specific Question:   ABG    Answer:   No    Order Specific Question:   Diffusion capacity (DLCO)    Answer:   Yes    Order Specific Question:   Lung volumes    Answer:   Yes    Order Specific Question:   Methacholine challenge    Answer:   No  No orders of the defined types were placed in this encounter.   Return after PFTs.  I have spent a total time of 45-minutes on the day of the appointment reviewing prior documentation, coordinating care and discussing medical diagnosis and plan with the patient/family. Imaging, labs and tests included in this note have been reviewed and interpreted independently by me.  Roniel Halloran Mechele Collin, MD [facility] 08/18/2020 3:38 PM  Office Number [phone]

## 2020-08-18 NOTE — Patient Instructions (Signed)
Abnormal CT scan concerning for fibrosis --Will arrange for pulmonary function tests (PFTs) --Labs ordered: ANA, CCP, RF to rule out autoimmune disease --If abnormal, will consider dedicated high resolution CT to evaluate lung parenchyma.  --If normal, annual pulmonary function test and routine CT chest if needed  Tobacco Abuse --STOP smoking/vaping  Follow up with me after PFTs

## 2020-08-20 LAB — CYCLIC CITRUL PEPTIDE ANTIBODY, IGG: Cyclic Citrullin Peptide Ab: <16 UNITS

## 2020-08-20 LAB — ANA: Anti Nuclear Antibody (ANA): NEGATIVE

## 2020-08-20 LAB — RHEUMATOID FACTOR: Rheumatoid fact SerPl-aCnc: <14 IU/mL (ref <14)

## 2020-08-21 ENCOUNTER — Encounter: Payer: Self-pay | Admitting: Pulmonary Disease

## 2020-09-14 IMAGING — MR MRI HEAD WITHOUT CONTRAST
10 series · 48 of 48 positions shown · non-contrast
Comparison: None.

CLINICAL DATA: Lacunar stroke

EXAM:
MRI HEAD WITHOUT CONTRAST
TECHNIQUE: Multiplanar, multiecho pulse sequences of the brain and surrounding
structures were obtained without intravenous contrast.

[Series 2: T1 · sagittal · 5.0mm · 0.45mm/px · 3 of 21 slices shown]
[im 1/21]
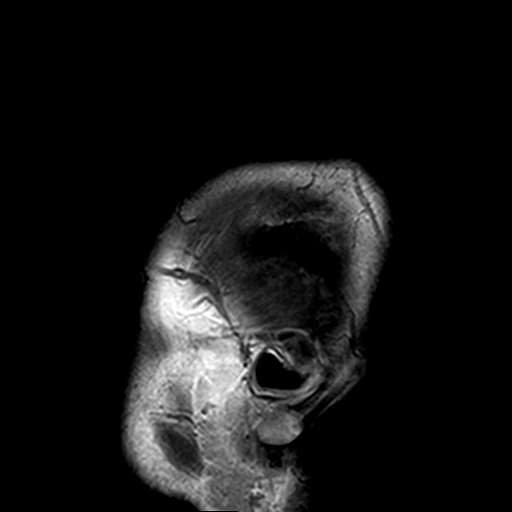
[im 11/21]
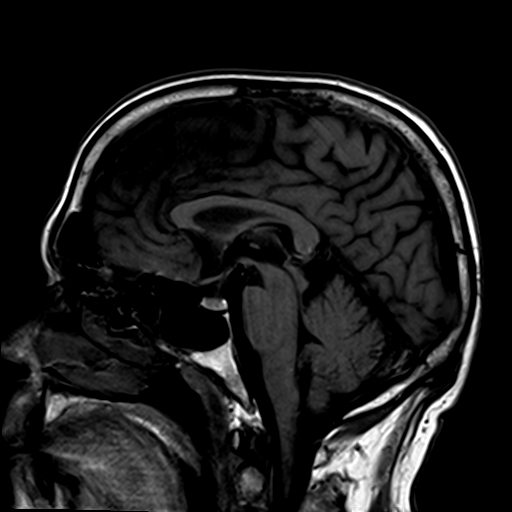
[im 21/21]
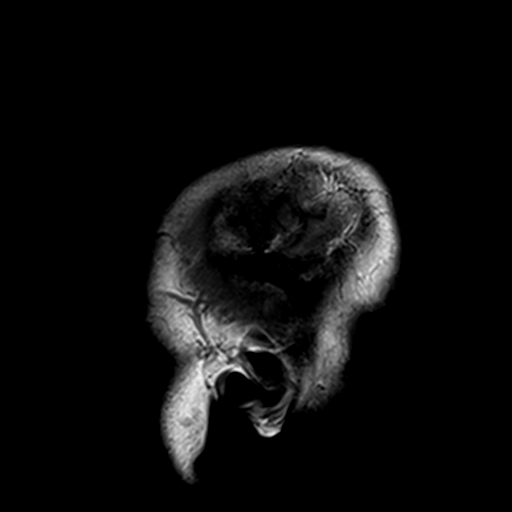

[Series 3: DWI · axial · 3.0mm · 1.80mm/px · z∈[-70,+73]mm · 9 of 100 slices shown (1 of 4)]
[im 1/100]
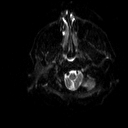
[im 13/100]
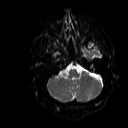
[im 25/100]
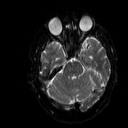
[im 38/100]
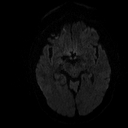
[im 50/100]
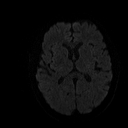
[im 62/100]
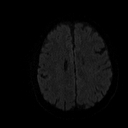
[im 75/100]
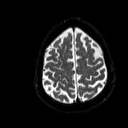
[im 87/100]
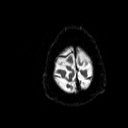
[im 100/100]
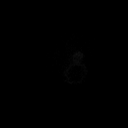

[Series 4: DWI · axial · 3.0mm · 1.80mm/px · z∈[-70,+73]mm · 4 of 47 slices shown (2 of 4)]
[im 1/47]
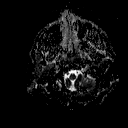
[im 16/47]
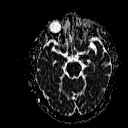
[im 31/47]
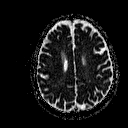
[im 47/47]
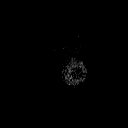

[Series 5: DWI · coronal · 5.0mm · 1.80mm/px · 6 of 68 slices shown (3 of 4)]
[im 1/68]
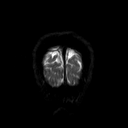
[im 14/68]
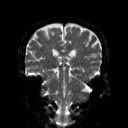
[im 27/68]
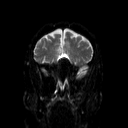
[im 41/68]
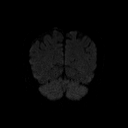
[im 54/68]
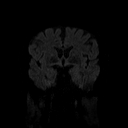
[im 68/68]
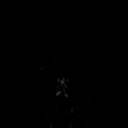

[Series 6: DWI · coronal · 5.0mm · 1.80mm/px · 3 of 34 slices shown (4 of 4)]
[im 1/34]
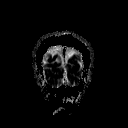
[im 17/34]
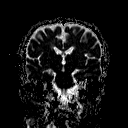
[im 34/34]
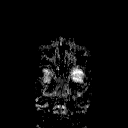

[Series 7: T2 · axial · 5.0mm · 0.51mm/px · z∈[-67,+76]mm · 2 of 22 slices shown (1 of 2)]
[im 1/22]
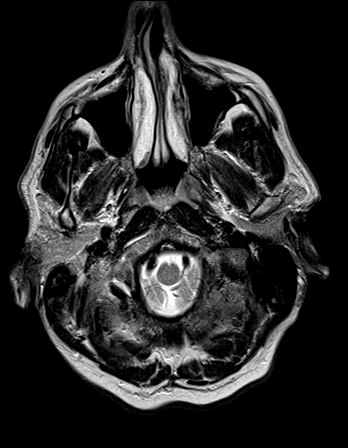
[im 22/22]
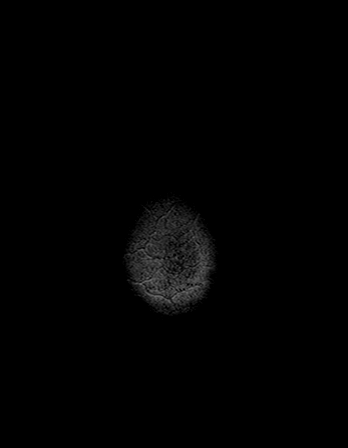

[Series 8: FLAIR · axial · 3.0mm · 0.45mm/px · z∈[-61,+71]mm · 3 of 30 slices shown]
[im 1/30]
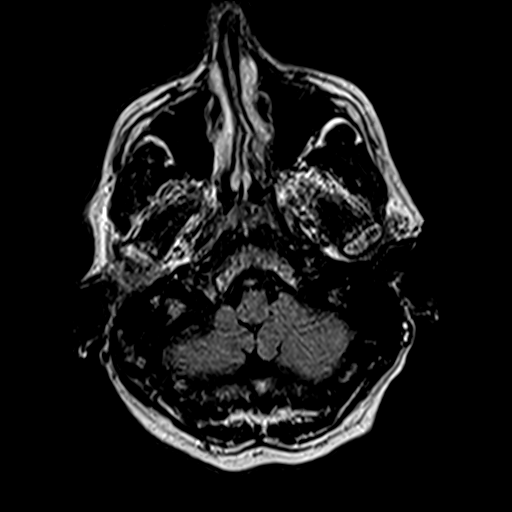
[im 15/30]
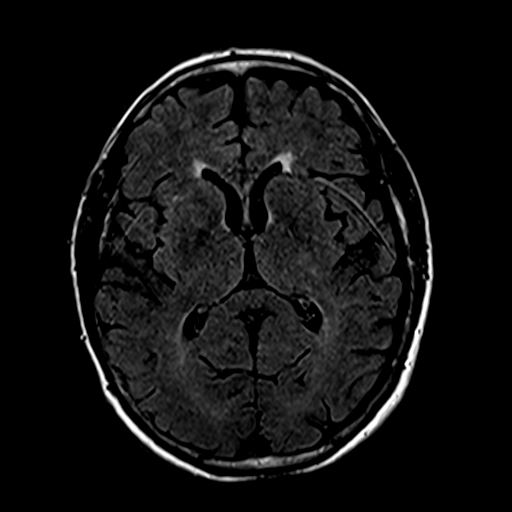
[im 30/30]
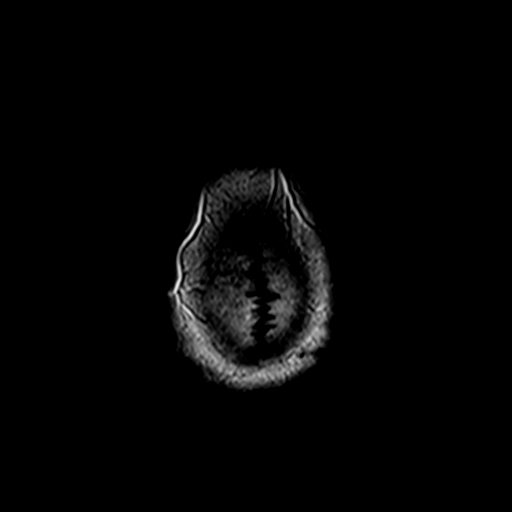

[Series 10: swi_images · axial · 4.0mm · 0.90mm/px · z∈[-64,+73]mm · 3 of 36 slices shown]
[im 1/36]
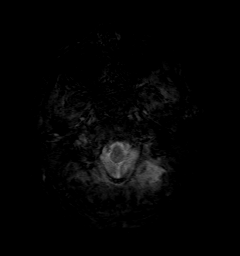
[im 18/36]
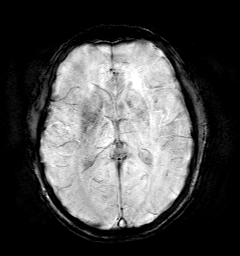
[im 36/36]
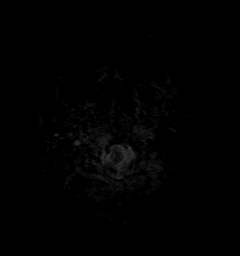

[Series 11: t1_mpr_tra · axial · 1.0mm · 0.71mm/px · z∈[-65,+74]mm · 13 of 144 slices shown]
[im 1/144]
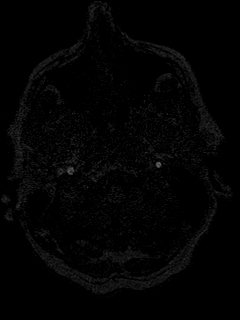
[im 12/144]
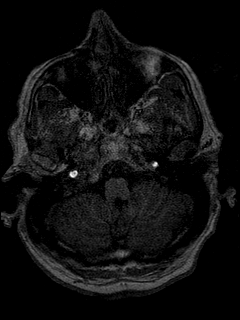
[im 24/144]
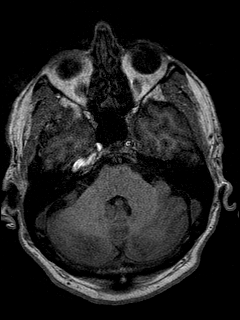
[im 36/144]
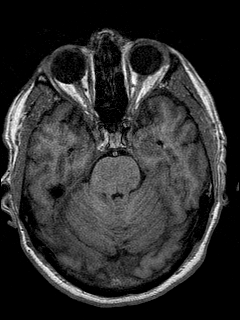
[im 48/144]
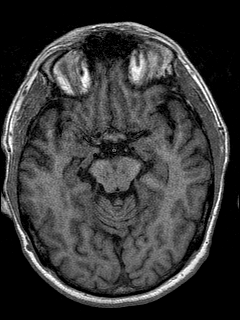
[im 60/144]
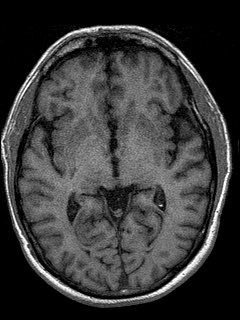
[im 72/144]
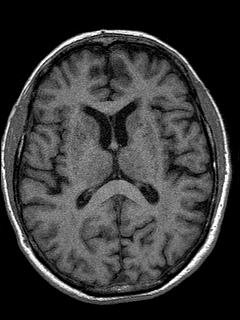
[im 84/144]
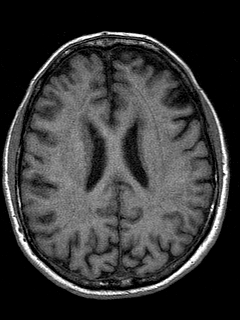
[im 96/144]
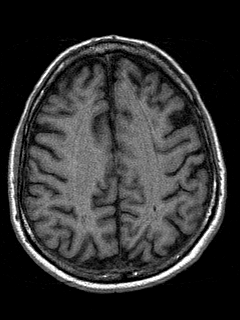
[im 108/144]
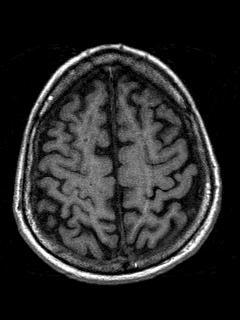
[im 120/144]
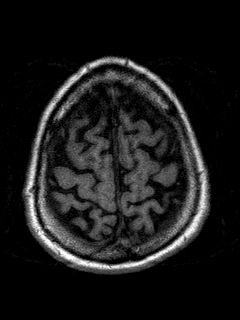
[im 132/144]
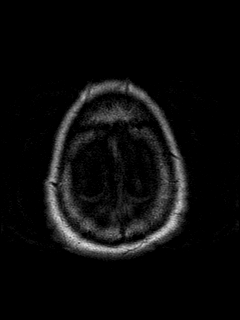
[im 144/144]
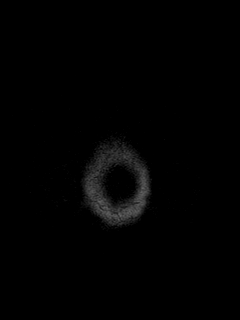

[Series 12: T2 · coronal · 5.0mm · 0.45mm/px · 2 of 24 slices shown (2 of 2)]
[im 1/24]
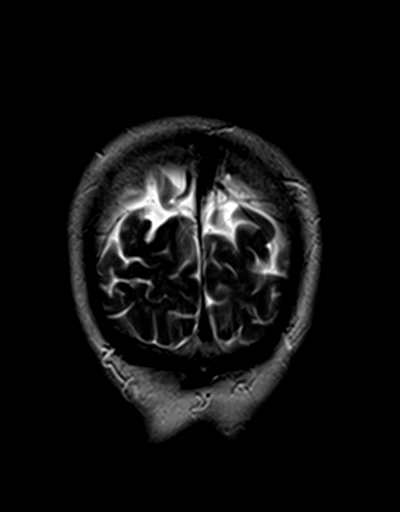
[im 24/24]
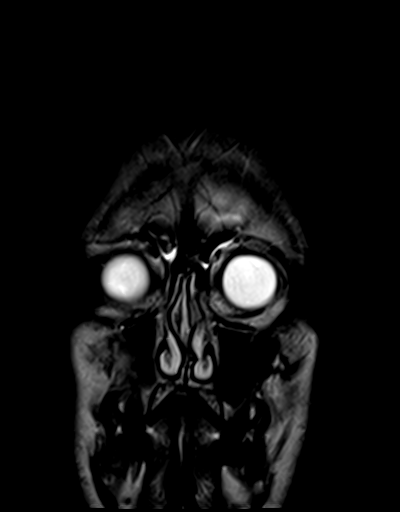

[48 of 48 positions shown; findings below may reference images not displayed]

FINDINGS: Brain: Negative for acute infarct. Mild chronic microvascular
ischemic change in the white matter. Negative for hemorrhage mass or
edema. Ventricle size normal. No midline shift.

Vascular: Normal arterial flow voids

Skull and upper cervical spine: No focal skeletal lesion.

Sinuses/Orbits: Mild mucosal edema paranasal sinuses. Bilateral
cataract surgery

Other: None
IMPRESSION: Negative for acute infarct. Mild chronic microvascular ischemic
change in the white matter.

## 2020-09-19 DIAGNOSIS — Z20822 Contact with and (suspected) exposure to covid-19: Secondary | ICD-10-CM | POA: Diagnosis not present

## 2020-09-19 DIAGNOSIS — U071 COVID-19: Secondary | ICD-10-CM | POA: Diagnosis not present

## 2020-09-23 ENCOUNTER — Ambulatory Visit: Payer: BC Managed Care – PPO | Admitting: Pulmonary Disease

## 2020-09-24 ENCOUNTER — Ambulatory Visit: Payer: BC Managed Care – PPO | Admitting: Cardiology

## 2020-09-29 ENCOUNTER — Encounter: Payer: Self-pay | Admitting: Cardiology

## 2020-09-29 ENCOUNTER — Ambulatory Visit: Payer: BC Managed Care – PPO | Admitting: Cardiology

## 2020-09-29 ENCOUNTER — Other Ambulatory Visit: Payer: Self-pay

## 2020-09-29 VITALS — BP 142/92 | HR 92 | Ht 71.0 in | Wt 204.0 lb

## 2020-09-29 DIAGNOSIS — I10 Essential (primary) hypertension: Secondary | ICD-10-CM | POA: Diagnosis not present

## 2020-09-29 DIAGNOSIS — I2510 Atherosclerotic heart disease of native coronary artery without angina pectoris: Secondary | ICD-10-CM | POA: Diagnosis not present

## 2020-09-29 DIAGNOSIS — F17299 Nicotine dependence, other tobacco product, with unspecified nicotine-induced disorders: Secondary | ICD-10-CM

## 2020-09-29 NOTE — Patient Instructions (Signed)
Medication Instructions:  Your physician recommends that you continue on your current medications as directed. Please refer to the Current Medication list given to you today.  *If you need a refill on your cardiac medications before your next appointment, please call your pharmacy*   Lab Work: None ordered.  If you have labs (blood work) drawn today and your tests are completely normal, you will receive your results only by: Marland Kitchen MyChart Message (if you have MyChart) OR . A paper copy in the mail If you have any lab test that is abnormal or we need to change your treatment, we will call you to review the results.   Testing/Procedures: None ordered.    Follow-Up: At [facility], you and your health needs are our priority.  As part of our continuing mission to provide you with exceptional heart care, we have created designated Provider Care Teams.  These Care Teams include your primary Cardiologist (physician) and Advanced Practice Providers (APPs -  Physician Assistants and Nurse Practitioners) who all work together to provide you with the care you need, when you need it.  We recommend signing up for the patient portal called "MyChart".  Sign up information is provided on this After Visit Summary.  MyChart is used to connect with patients for Virtual Visits (Telemedicine).  Patients are able to view lab/test results, encounter notes, upcoming appointments, etc.  Non-urgent messages can be sent to your provider as well.   To learn more about what you can do with MyChart, go to [email or web address].    Your next appointment:   12 month(s)  The format for your next appointment:   In Person  Provider:   Steffanie Dunn, MD   Other Instructions  Billing phone number [phone] - option 7.

## 2020-09-29 NOTE — Progress Notes (Signed)
Electrophysiology Office Follow up Visit Note:    Date:  09/29/2020   ID:  Julian Sullivan, DOB Jun 01, 1956, MRN [id number]  PCP:  Rodrigo Ran, MD  [facility] Cardiologist:  No primary care provider on file.  [facility] Electrophysiologist:  Lanier Prude, MD    Interval History:    Julian Sullivan is a 65 y.o. male who presents for a follow up visit. They were last seen in clinic June 24, 2020.  Given an elevated coronary artery calcium score, the patient was started on rosuvastatin at that appointment.  Since that appointment this medication has been uptitrated to 20 mg daily.  He is tolerating the rosuvastatin well without any myalgias.  He had previously experienced myalgias on atorvastatin.     Past Medical History:  Diagnosis Date  . Abnormal LFTs   . Back strain   . Cervicalgia   . Colon polyps   . Colonic polyp   . DDD (degenerative disc disease), cervical   . Dermatitis   . Dysuria   . Dysuria   . GERD (gastroesophageal reflux disease)   . Hyperlipidemia   . IFG (impaired fasting glucose)   . Microalbuminuria   . Neck pain   . Neck pain   . Obesity   . Pain in joint, multiple sites   . Proteinuria   . Snoring   . Snoring   . Tachycardia   . Tobacco abuse   . Trigger finger   . White coat syndrome with hypertension     Past Surgical History:  Procedure Laterality Date  . EYE SURGERY     lower eyelids not working   . LUMBAR LAMINECTOMY  1994  . mixoid cyst off finger       Current Medications: Current Meds  Medication Sig  . Acetaminophen (TYLENOL PO) Take 200 mg by mouth as needed.  . ASPIRIN LOW DOSE 81 MG chewable tablet Chew 81 mg by mouth daily.   Marland Kitchen buPROPion (WELLBUTRIN XL) 150 MG 24 hr tablet Take by mouth.  . Cholecalciferol (VITAMIN D3) 125 MCG (5000 UT) TABS Take by mouth.  Marland Kitchen lisinopril (ZESTRIL) 10 MG tablet Take by mouth.  . rosuvastatin (CRESTOR) 20 MG tablet Take 1 tablet (20 mg total) by mouth daily.     Allergies:    Fluvastatin and Percocet [oxycodone-acetaminophen]   Social History   Socioeconomic History  . Marital status: Married    Spouse name: Not on file  . Number of children: Not on file  . Years of education: Not on file  . Highest education level: Not on file  Occupational History  . Not on file  Tobacco Use  . Smoking status: Former Smoker    Packs/day: 0.50    Types: Cigarettes    Quit date: 06/20/2020    Years since quitting: 0.2  . Smokeless tobacco: Never Used  . Tobacco comment: Currently vaping  Vaping Use  . Vaping Use: Every day  Substance and Sexual Activity  . Alcohol use: Yes    Alcohol/week: 2.0 standard drinks    Types: 2 Glasses of wine per week  . Drug use: Not Currently  . Sexual activity: Not on file  Other Topics Concern  . Not on file  Social History Narrative  . Not on file   Social Determinants of Health   Financial Resource Strain: Not on file  Food Insecurity: Not on file  Transportation Needs: Not on file  Physical Activity: Not on file  Stress: Not  on file  Social Connections: Not on file     Family History: The patient's family history includes Heart attack in his father.  ROS:   Please see the history of present illness.    All other systems reviewed and are negative.  EKGs/Labs/Other Studies Reviewed:    The following studies were reviewed today:   EKG:  The ekg ordered today demonstrates sinus rhythm.  Recent Labs: No results found for requested labs within last 8760 hours.  Recent Lipid Panel    Component Value Date/Time   CHOL 169 03/27/2019 1658   TRIG 246 (H) 03/27/2019 1658   HDL 40 03/27/2019 1658   CHOLHDL 4.2 03/27/2019 1658   LDLCALC 80 03/27/2019 1658    Physical Exam:    VS:  BP (!) 142/92   Pulse 92   Ht 5\' 11"  (1.803 m)   Wt 204 lb (92.5 kg)   SpO2 99%   BMI 28.45 kg/m     Wt Readings from Last 3 Encounters:  09/29/20 204 lb (92.5 kg)  08/18/20 210 lb 4 oz (95.4 kg)  06/24/20 213 lb 3.2 oz (96.7  kg)     GEN:  Well nourished, well developed in no acute distress HEENT: Normal NECK: No JVD; No carotid bruits LYMPHATICS: No lymphadenopathy CARDIAC: RRR, no murmurs, rubs, gallops RESPIRATORY:  Clear to auscultation without rales, wheezing or rhonchi  ABDOMEN: Soft, non-tender, non-distended MUSCULOSKELETAL:  No edema; No deformity  SKIN: Warm and dry NEUROLOGIC:  Alert and oriented x 3 PSYCHIATRIC:  Normal affect   ASSESSMENT:    1. Coronary artery disease involving native coronary artery of native heart, unspecified whether angina present   2. Essential hypertension   3. 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 Coronary artery disease No ischemic symptoms during today's visit. Continue aspirin 81 mg daily in addition to his rosuvastatin 20 mg daily. Encouraged him to increase his exercise level during today's visit.  2.  Hypertension Controlled.  Patient reports systolic blood pressures from 06/26/20 to 122 mmHg on his home checks. Continue lisinopril 10 mg daily  3.  Nicotine We discussed weaning from his vaping gradually over time.  Plan to follow-up in 1 year or sooner as needed. Medication Adjustments/Labs and Tests Ordered: Current medicines are reviewed at length with the patient today.  Concerns regarding medicines are outlined above.  Orders Placed This Encounter  Procedures  . EKG 12-Lead   No orders of the defined types were placed in this encounter.    Signed, 295, MD, [facility]  09/29/2020 4:34 PM    Electrophysiology [facility]

## 2020-10-10 ENCOUNTER — Other Ambulatory Visit (HOSPITAL_COMMUNITY): Payer: BC Managed Care – PPO

## 2020-10-13 ENCOUNTER — Ambulatory Visit: Payer: BC Managed Care – PPO | Admitting: Pulmonary Disease

## 2021-06-12 ENCOUNTER — Encounter: Payer: Self-pay | Admitting: Pulmonary Disease

## 2021-06-12 ENCOUNTER — Ambulatory Visit (INDEPENDENT_AMBULATORY_CARE_PROVIDER_SITE_OTHER): Payer: Managed Care, Other (non HMO) | Admitting: Pulmonary Disease

## 2021-06-12 ENCOUNTER — Other Ambulatory Visit: Payer: Self-pay

## 2021-06-12 VITALS — BP 124/84 | HR 107 | Temp 98.4°F | Ht 71.0 in | Wt 227.6 lb

## 2021-06-12 DIAGNOSIS — J8410 Pulmonary fibrosis, unspecified: Secondary | ICD-10-CM | POA: Diagnosis not present

## 2021-06-12 DIAGNOSIS — J849 Interstitial pulmonary disease, unspecified: Secondary | ICD-10-CM | POA: Diagnosis not present

## 2021-06-12 MED ORDER — ALBUTEROL SULFATE HFA 108 (90 BASE) MCG/ACT IN AERS: 2.0000 | INHALATION_SPRAY | Freq: Four times a day (QID) | RESPIRATORY_TRACT | 2 refills | Status: DC | PRN

## 2021-06-12 NOTE — Patient Instructions (Signed)
Pulmonary fibrosis --ARRANGE for pulmonary function tests (PFTs) --ORDER high resolution CT chest without contrast to evaluate lung parenchyma --START anti-reflux medications (e.g. omeprazole 20 mg daily) --Encourage regular aerobic exercise (e.g. 10,000 steps)  Tobacco Abuse --Congratulations on quitting!

## 2021-06-12 NOTE — Progress Notes (Signed)
Subjective:   PATIENT ID: Julian Sullivan GENDER: male DOB: Aug 06, 1956, MRN: [id number]   HPI  Chief Complaint  Patient presents with   Follow-up    Pt states since May 2022, he stopped vaping but is still doing nicotine toothpicks so he can still have the nicotine. Pt has had some complaints of SOB and an occ cough. Pt has also had some complaints of joint pain.    Reason for Visit: Follow-up  Mr. Haleem Hanner is a 65 year old male with HTN, HLD and CAD who presents for follow-up.  08/18/20 His PCP recently ordered coronary calcium CT as part of his routine evaluation and was referred to Cardiology for abnormal CAC score and was started on high intensity statin. His CT also demonstrated suggestive of fibrosis so he was referred to Pulmonary for evaluation.  He reports that he Is an active male with no limitations in activity. Denies shortness of breath, cough, wheezing. No pleuritic chest pain, chest tightness. Previously smoked 1/2ppd however quit cigarette two months ago and now currently vapes including flavored products  06/12/21 Since our last visit, he quit vaping in May 2022 when he noticed he has had gradually worsening shortness of breath with activity. He is not active at [facility]. He is using zippix toothpicks. Denies wheezing or cough.  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 Quit 2 months ago. Currently vaping with flavors Prior notes mention etOH use 2-3 drinks daily   Past Medical History:  Diagnosis Date   Abnormal LFTs    Back strain    Cervicalgia    Colon polyps    Colonic polyp    DDD (degenerative disc disease), cervical    Dermatitis    Dysuria    Dysuria    GERD (gastroesophageal reflux disease)    Hyperlipidemia    IFG (impaired fasting glucose)    Microalbuminuria    Neck pain    Neck pain    Obesity    Pain in joint, multiple sites     Proteinuria    Snoring    Snoring    Tachycardia    Tobacco abuse    Trigger finger    White coat syndrome with hypertension      Family History  Problem Relation Age of Onset   Heart attack Father      Social History   Occupational History   Not on file  Tobacco Use   Smoking status: Former    Packs/day: 0.50    Types: Cigarettes    Quit date: 06/20/2020    Years since quitting: 0.9   Smokeless tobacco: Never   Tobacco comments:    Stopped vaping 12/2020; currently using nicotine tooth picks  Vaping Use   Vaping Use: Every day  Substance and Sexual Activity   Alcohol use: Yes    Alcohol/week: 2.0 standard drinks    Types: 2 Glasses of wine per week   Drug use: Not Currently   Sexual activity: Not on file    Allergies  Allergen Reactions   Fluvastatin    Percocet [Oxycodone-Acetaminophen] Other (See Comments)    Severe dreams     Outpatient Medications Prior to Visit  Medication Sig Dispense Refill   Acetaminophen (TYLENOL PO) Take 200 mg by mouth as needed.     ASPIRIN LOW DOSE 81 MG chewable tablet Chew 81 mg by mouth daily.      buPROPion (  WELLBUTRIN XL) 150 MG 24 hr tablet Take by mouth.     Cholecalciferol (VITAMIN D3) 125 MCG (5000 UT) TABS Take by mouth.     lisinopril (ZESTRIL) 10 MG tablet Take by mouth.     predniSONE (DELTASONE) 5 MG tablet Take 5 mg by mouth daily with breakfast.     REPATHA SURECLICK 140 MG/ML SOAJ Inject 1 Syringe into the skin every 14 (fourteen) days.     TART CHERRY PO Take by mouth.     TURMERIC PO Take by mouth.     rosuvastatin (CRESTOR) 20 MG tablet Take 1 tablet (20 mg total) by mouth daily. 90 tablet 1   No facility-administered medications prior to visit.    Review of Systems  Constitutional:  Negative for chills, diaphoresis, fever, malaise/fatigue and weight loss.  HENT:  Negative for congestion.   Respiratory:  Positive for shortness of breath. Negative for cough, hemoptysis, sputum production and wheezing.    Cardiovascular:  Negative for chest pain, palpitations and leg swelling.    Objective:   Vitals:   06/12/21 1450  BP: 124/84  Pulse: (!) 107  Temp: 98.4 F (36.9 C)  TempSrc: Oral  SpO2: 98%  Weight: 227 lb 9.6 oz (103.2 kg)  Height: 5\' 11"  (1.803 m)   SpO2: 98 % (RA) O2 Device: None (Room air)  Physical Exam: General: Well-appearing, no acute distress HENT: , AT Eyes: EOMI, no scleral icterus Respiratory: Bibasilar inspiratory crackles, no wheezing Cardiovascular: RRR, -M/R/G, no JVD Extremities:-Edema,-tenderness Neuro: AAO x4, CNII-XII grossly intact Psych: Normal mood, normal affect  Data Reviewed:  Imaging: CT Cardiac Scoring 06/12/20 - Diffuse subpleural reticulation with groundglass with prominent areas of interstitial thickening noted in the left anterior upper lung and right lung base.  PFT: None on file    Assessment & Plan:   Discussion: 65 year old male former smoker/vaper with pulmonary fibrosis of unknown etiology who presents for follow-up. He was previously asymptomatic however has developed shortness of breath in the last year. Negative autoimmune work-up.  We discussed the clinical course of sarcoid and management of fibrosis including serial PFTs, chest imaging and early treatment of espiratory infection +/- flare.  Pulmonary fibrosis --ARRANGE for pulmonary function tests (PFTs) --START Albuterol as needed for shortness of breath or wheezing --ORDER high resolution CT chest without contrast to evaluate lung parenchyma --START anti-reflux medications (e.g. omeprazole 20 mg daily) --Encourage regular aerobic exercise (e.g. 10,000 steps)  Tobacco Abuse --Congratulations on quitting!   Health Maintenance Immunization History  Administered Date(s) Administered   PFIZER(Purple Top)SARS-COV-2 Vaccination 04/10/2020, 05/07/2020   CT Lung Screen - CT imaging as noted above  Orders Placed This Encounter  Procedures   CT Chest High  Resolution    Please schedule next avail    Standing Status:   Future    Standing Expiration Date:   06/12/2022    Order Specific Question:   Preferred imaging location?    Answer:   [facility]   Pulmonary function test    Standing Status:   Future    Standing Expiration Date:   06/12/2022    Order Specific Question:   Where should this test be performed?    Answer:   [facility]    Order Specific Question:   Full PFT: includes the following: basic spirometry, spirometry pre & post bronchodilator, diffusion capacity (DLCO), lung volumes    Answer:   Full PFT   Meds ordered this encounter  Medications   albuterol (  VENTOLIN HFA) 108 (90 Base) MCG/ACT inhaler    Sig: Inhale 2 puffs into the lungs every 6 (six) hours as needed for wheezing or shortness of breath.    Dispense:  8 g    Refill:  2    No follow-ups on file. F/u after PFTs  I have spent a total time of 38-minutes on the day of the appointment reviewing prior documentation, coordinating care and discussing medical diagnosis and plan with the patient/family. Past medical history, allergies, medications were reviewed. Pertinent imaging, labs and tests included in this note have been reviewed and interpreted independently by me.  Ryleah Miramontes Mechele Collin, MD [facility] 06/12/2021 3:02 PM  Office Number [phone]

## 2021-06-23 ENCOUNTER — Ambulatory Visit: Payer: Self-pay

## 2021-06-23 ENCOUNTER — Ambulatory Visit (INDEPENDENT_AMBULATORY_CARE_PROVIDER_SITE_OTHER): Payer: Managed Care, Other (non HMO) | Admitting: Orthopaedic Surgery

## 2021-06-23 ENCOUNTER — Other Ambulatory Visit: Payer: Self-pay

## 2021-06-23 DIAGNOSIS — M25512 Pain in left shoulder: Secondary | ICD-10-CM

## 2021-06-23 DIAGNOSIS — M25511 Pain in right shoulder: Secondary | ICD-10-CM

## 2021-06-23 DIAGNOSIS — G8929 Other chronic pain: Secondary | ICD-10-CM

## 2021-06-23 MED ORDER — METHYLPREDNISOLONE ACETATE 40 MG/ML IJ SUSP
40.0000 mg | INTRAMUSCULAR | Status: AC | PRN
  Administered 2021-06-23: 40 mg via INTRA_ARTICULAR

## 2021-06-23 MED ORDER — BUPIVACAINE HCL 0.25 % IJ SOLN
2.0000 mL | INTRAMUSCULAR | Status: AC | PRN
  Administered 2021-06-23: 2 mL via INTRA_ARTICULAR

## 2021-06-23 MED ORDER — TRAMADOL HCL 50 MG PO TABS: 50.0000 mg | ORAL_TABLET | Freq: Two times a day (BID) | ORAL | 2 refills | Status: DC | PRN

## 2021-06-23 MED ORDER — LIDOCAINE HCL 2 % IJ SOLN
2.0000 mL | INTRAMUSCULAR | Status: AC | PRN
Start: 2021-06-23 — End: 2021-06-23
  Administered 2021-06-23: 2 mL

## 2021-06-23 NOTE — Progress Notes (Signed)
Office Visit Note   Patient: Julian Sullivan           Date of Birth: 1955-09-26           MRN: [id number] Visit Date: 06/23/2021              Requested by: Rodrigo Ran, MD [address] [place],  [place] [address] PCP: Rodrigo Ran, MD   Assessment & Plan: Visit Diagnoses:  1. Chronic left shoulder pain   2. Chronic right shoulder pain     Plan: Impression is multiple joint pain, but primarily bilateral shoulders left greater than right.  At this point, it autoimmune disease has been ruled out.  It is hard to tell whether this is coming from an orthopedic issue or the statin medication.  We have discussed proceeding with a diagnostic and hopefully therapeutic left shoulder subacromial cortisone injection for which he would like to proceed.  If he does not notice any relief over the next several weeks he will call us and let us know.  Otherwise, follow-up with [place] as needed.  Call with concerns or questions in meantime.  Follow-Up Instructions: Return if symptoms worsen or fail to improve.   Orders:  Orders Placed This Encounter  Procedures   Large Joint Inj: L subacromial bursa   XR Shoulder Left   XR Shoulder Right   No orders of the defined types were placed in this encounter.     Procedures: Large Joint Inj: L subacromial bursa on 06/23/2021 3:06 PM Indications: pain Details: 22 G needle Medications: 2 mL lidocaine 2 %; 2 mL bupivacaine 0.25 %; 40 mg methylPREDNISolone acetate 40 MG/ML Outcome: tolerated well, no immediate complications Patient was prepped and draped in the usual sterile fashion.      Clinical Data: No additional findings.   Subjective: Chief Complaint  Patient presents with   Left Shoulder - Pain   Right Shoulder - Pain    HPI patient is a pleasant 65 year old gentleman who comes in today with bilateral shoulder pain left greater than right, bilateral knee pain and bilateral hip pain for the past 6 weeks.  No known injury or change in  activity.  He works as a Emergency planning/management officer.  He notes that he has been on a statin until recently when the joint pain started.  He was then worked up for autoimmune disease back in 2020.  Lab work was within normal limits.  He notes that he has had the similar symptoms several years ago and was seen by Dr. Corliss Skains where he was told to eat tart cherry.  This did not help.  Currently, the shoulders do appear to be the worse than the hips and knees.  Pain is worse with forward flexion.  He denies any paresthesias to the upper extremity.  He was recently on a Sterapred taper which significantly helped until we finish the medication.  No previous cortisone injection to the shoulder.  Review of Systems as detailed in HPI.  All others reviewed and are negative.   Objective: Vital Signs: There were no vitals taken for this visit.  Physical Exam well-developed well-nourished gentleman in no acute distress.  Alert and oriented x3.  Ortho Exam right shoulder exam shows full active range of motion all planes.  He has mild pain with the extremes of forward flexion.  5 out of 5 strength.  Left shoulder exam shows forward flexion and abduction to about 90 degrees.  Moderately positive empty can with 4 out of  5 strength.  Mild tenderness to the [facility] joint.  Positive cross body adduction.  He is neurovascular intact distally.  Specialty Comments:  No specialty comments available.  Imaging: XR Shoulder Left  Result Date: 06/23/2021 No acute or structural abnormalities  XR Shoulder Right  Result Date: 06/23/2021 No acute or structural abnormalities    PMFS History: Patient Active Problem List   Diagnosis Date Noted   Pulmonary fibrosis (HCC) 06/12/2021   Numbness 07/01/2020   Pre-diabetes 07/01/2020   Myalgia due to statin 07/01/2020   Coronary artery disease involving native heart 07/01/2020   Hyperlipidemia 07/01/2020   Essential hypertension 03/11/2019   ETOH abuse 03/11/2019   Tobacco abuse  03/11/2019   Left hand pain 06/22/2017   Trigger finger of left thumb 06/22/2017   Ruptured extensor tendon of hand or wrist, left, initial encounter 06/22/2017   Contracture of finger joint, left 07/07/2016   Mucoid cyst, joint 07/07/2016   Past Medical History:  Diagnosis Date   Abnormal LFTs    Back strain    Cervicalgia    Colon polyps    Colonic polyp    DDD (degenerative disc disease), cervical    Dermatitis    Dysuria    Dysuria    GERD (gastroesophageal reflux disease)    Hyperlipidemia    IFG (impaired fasting glucose)    Microalbuminuria    Neck pain    Neck pain    Obesity    Pain in joint, multiple sites    Proteinuria    Snoring    Snoring    Tachycardia    Tobacco abuse    Trigger finger    White coat syndrome with hypertension     Family History  Problem Relation Age of Onset   Heart attack Father     Past Surgical History:  Procedure Laterality Date   EYE SURGERY     lower eyelids not working    LUMBAR LAMINECTOMY  1994   mixoid cyst off finger      Social History   Occupational History   Not on file  Tobacco Use   Smoking status: Former    Packs/day: 0.50    Types: Cigarettes    Quit date: 06/20/2020    Years since quitting: 1.0   Smokeless tobacco: Never   Tobacco comments:    Stopped vaping 12/2020; currently using nicotine tooth picks  Vaping Use   Vaping Use: Every day  Substance and Sexual Activity   Alcohol use: Yes    Alcohol/week: 2.0 standard drinks    Types: 2 Glasses of wine per week   Drug use: Not Currently   Sexual activity: Not on file

## 2021-06-30 ENCOUNTER — Inpatient Hospital Stay: Admission: RE | Admit: 2021-06-30 | Payer: Managed Care, Other (non HMO) | Source: Ambulatory Visit

## 2021-07-03 ENCOUNTER — Ambulatory Visit: Payer: Managed Care, Other (non HMO) | Admitting: Cardiology

## 2021-07-13 ENCOUNTER — Telehealth: Payer: Self-pay

## 2021-07-13 NOTE — Telephone Encounter (Signed)
He can make another appt.  Thanks.

## 2021-07-13 NOTE — Telephone Encounter (Signed)
Pt called stating the pain is back in his hip and shoulder. He wanted to know if he should make another apt or what was the next steps.

## 2021-07-15 ENCOUNTER — Telehealth: Payer: Self-pay

## 2021-07-15 NOTE — Telephone Encounter (Signed)
See message.  Thanks.

## 2021-07-15 NOTE — Telephone Encounter (Signed)
Julian Sullivan, can you see how much the injection helped when he was getting relief?  If was helping quite a bit, but only temporary, lets get an mri

## 2021-07-15 NOTE — Telephone Encounter (Signed)
Pt called stating that his injection helped for a few days and now he cant lift his arm. Please advise on what patient can do next

## 2021-07-17 NOTE — Telephone Encounter (Signed)
Called patient no answer LMOM.

## 2021-08-03 ENCOUNTER — Ambulatory Visit: Payer: Managed Care, Other (non HMO) | Admitting: Pulmonary Disease

## 2021-08-05 NOTE — Progress Notes (Deleted)
Electrophysiology Office Follow up Visit Note:    Date:  08/05/2021   ID:  KASAN LIGHTLE, DOB Mar 14, 1956, MRN [id number]  PCP:  Crist Infante, MD  [facility] Cardiologist:  None  [facility] Electrophysiologist:  Vickie Epley, MD    Interval History:    Julian Sullivan is a 65 y.o. male who presents for a follow up visit.  I last saw the patient September 29, 2020 for coronary artery disease.  He was taking rosuvastatin.  His medical history also includes tobacco abuse and hypertension and pulmonary fibrosis.       Past Medical History:  Diagnosis Date   Abnormal LFTs    Back strain    Cervicalgia    Colon polyps    Colonic polyp    DDD (degenerative disc disease), cervical    Dermatitis    Dysuria    Dysuria    GERD (gastroesophageal reflux disease)    Hyperlipidemia    IFG (impaired fasting glucose)    Microalbuminuria    Neck pain    Neck pain    Obesity    Pain in joint, multiple sites    Proteinuria    Snoring    Snoring    Tachycardia    Tobacco abuse    Trigger finger    White coat syndrome with hypertension     Past Surgical History:  Procedure Laterality Date   EYE SURGERY     lower eyelids not working    LUMBAR LAMINECTOMY  1994   mixoid cyst off finger       Current Medications: No outpatient medications have been marked as taking for the 08/06/21 encounter (Appointment) with Vickie Epley, MD.     Allergies:   Fluvastatin and Percocet [oxycodone-acetaminophen]   Social History   Socioeconomic History   Marital status: Married    Spouse name: Not on file   Number of children: Not on file   Years of education: Not on file   Highest education level: Not on file  Occupational History   Not on file  Tobacco Use   Smoking status: Former    Packs/day: 0.50    Types: Cigarettes    Quit date: 06/20/2020    Years since quitting: 1.1   Smokeless tobacco: Never   Tobacco comments:    Stopped vaping 12/2020; currently using  nicotine tooth picks  Vaping Use   Vaping Use: Every day  Substance and Sexual Activity   Alcohol use: Yes    Alcohol/week: 2.0 standard drinks    Types: 2 Glasses of wine per week   Drug use: Not Currently   Sexual activity: Not on file  Other Topics Concern   Not on file  Social History Narrative   Not on file   Social Determinants of Health   Financial Resource Strain: Not on file  Food Insecurity: Not on file  Transportation Needs: Not on file  Physical Activity: Not on file  Stress: Not on file  Social Connections: Not on file     Family History: The patient's family history includes Heart attack in his father.  ROS:   Please see the history of present illness.    All other systems reviewed and are negative.  EKGs/Labs/Other Studies Reviewed:    The following studies were reviewed today: ***  EKG:  The ekg ordered today demonstrates ***  Recent Labs: No results found for requested labs within last 8760 hours.  Recent Lipid Panel    Component  Value Date/Time   CHOL 169 03/27/2019 1658   TRIG 246 (H) 03/27/2019 1658   HDL 40 03/27/2019 1658   CHOLHDL 4.2 03/27/2019 1658   LDLCALC 80 03/27/2019 1658    Physical Exam:    VS:  There were no vitals taken for this visit.    Wt Readings from Last 3 Encounters:  06/12/21 227 lb 9.6 oz (103.2 kg)  09/29/20 204 lb (92.5 kg)  08/18/20 210 lb 4 oz (95.4 kg)     GEN: *** Well nourished, well developed in no acute distress HEENT: Normal NECK: No JVD; No carotid bruits LYMPHATICS: No lymphadenopathy CARDIAC: ***RRR, no murmurs, rubs, gallops RESPIRATORY:  Clear to auscultation without rales, wheezing or rhonchi  ABDOMEN: Soft, non-tender, non-distended MUSCULOSKELETAL:  No edema; No deformity  SKIN: Warm and dry NEUROLOGIC:  Alert and oriented x 3 PSYCHIATRIC:  Normal affect        ASSESSMENT:    No diagnosis found. PLAN:    In order of problems listed above:    ?  Pulmonary follow-up for  fibrosis       Total time spent with patient today *** minutes. This includes reviewing records, evaluating the patient and coordinating care.   Medication Adjustments/Labs and Tests Ordered: Current medicines are reviewed at length with the patient today.  Concerns regarding medicines are outlined above.  No orders of the defined types were placed in this encounter.  No orders of the defined types were placed in this encounter.    Signed, Steffanie Dunn, MD, [facility], [facility] 08/05/2021 8:41 PM    Electrophysiology [facility]

## 2021-08-06 ENCOUNTER — Ambulatory Visit: Payer: Managed Care, Other (non HMO) | Admitting: Cardiology

## 2021-08-06 NOTE — Patient Instructions (Incomplete)

## 2022-01-18 DIAGNOSIS — M5386 Other specified dorsopathies, lumbar region: Secondary | ICD-10-CM | POA: Diagnosis not present

## 2022-01-18 DIAGNOSIS — M9901 Segmental and somatic dysfunction of cervical region: Secondary | ICD-10-CM | POA: Diagnosis not present

## 2022-01-18 DIAGNOSIS — M9902 Segmental and somatic dysfunction of thoracic region: Secondary | ICD-10-CM | POA: Diagnosis not present

## 2022-01-18 DIAGNOSIS — M5032 Other cervical disc degeneration, mid-cervical region, unspecified level: Secondary | ICD-10-CM | POA: Diagnosis not present

## 2022-01-18 DIAGNOSIS — M5414 Radiculopathy, thoracic region: Secondary | ICD-10-CM | POA: Diagnosis not present

## 2022-01-18 DIAGNOSIS — M9903 Segmental and somatic dysfunction of lumbar region: Secondary | ICD-10-CM | POA: Diagnosis not present

## 2022-02-01 DIAGNOSIS — M65872 Other synovitis and tenosynovitis, left ankle and foot: Secondary | ICD-10-CM | POA: Diagnosis not present

## 2022-02-01 DIAGNOSIS — M19072 Primary osteoarthritis, left ankle and foot: Secondary | ICD-10-CM | POA: Diagnosis not present

## 2022-02-01 DIAGNOSIS — M65871 Other synovitis and tenosynovitis, right ankle and foot: Secondary | ICD-10-CM | POA: Diagnosis not present

## 2022-02-01 DIAGNOSIS — M19071 Primary osteoarthritis, right ankle and foot: Secondary | ICD-10-CM | POA: Diagnosis not present

## 2022-02-22 DIAGNOSIS — M19072 Primary osteoarthritis, left ankle and foot: Secondary | ICD-10-CM | POA: Diagnosis not present

## 2022-02-22 DIAGNOSIS — M19071 Primary osteoarthritis, right ankle and foot: Secondary | ICD-10-CM | POA: Diagnosis not present

## 2022-02-22 DIAGNOSIS — M65872 Other synovitis and tenosynovitis, left ankle and foot: Secondary | ICD-10-CM | POA: Diagnosis not present

## 2022-02-22 DIAGNOSIS — M65871 Other synovitis and tenosynovitis, right ankle and foot: Secondary | ICD-10-CM | POA: Diagnosis not present

## 2022-03-11 ENCOUNTER — Encounter: Payer: Self-pay | Admitting: Pulmonary Disease

## 2022-03-11 ENCOUNTER — Ambulatory Visit (INDEPENDENT_AMBULATORY_CARE_PROVIDER_SITE_OTHER): Payer: PPO | Admitting: Pulmonary Disease

## 2022-03-11 VITALS — BP 118/98 | HR 100 | Temp 98.2°F | Ht 70.0 in | Wt 226.2 lb

## 2022-03-11 DIAGNOSIS — J8410 Pulmonary fibrosis, unspecified: Secondary | ICD-10-CM

## 2022-03-11 DIAGNOSIS — J849 Interstitial pulmonary disease, unspecified: Secondary | ICD-10-CM

## 2022-03-11 LAB — PULMONARY FUNCTION TEST
DL/VA % pred: 94 %
DL/VA: 3.92 ml/min/mmHg/L
DLCO cor % pred: 100 %
DLCO cor: 26.8 ml/min/mmHg
DLCO unc % pred: 100 %
DLCO unc: 26.8 ml/min/mmHg
FEF 25-75 Post: 3.68 L/sec
FEF 25-75 Pre: 3.16 L/sec
FEF2575-%Change-Post: 16 %
FEF2575-%Pred-Post: 135 %
FEF2575-%Pred-Pre: 116 %
FEV1-%Change-Post: 2 %
FEV1-%Pred-Post: 110 %
FEV1-%Pred-Pre: 107 %
FEV1-Post: 3.78 L
FEV1-Pre: 3.7 L
FEV1FVC-%Change-Post: 0 %
FEV1FVC-%Pred-Pre: 106 %
FEV6-%Change-Post: 2 %
FEV6-%Pred-Post: 108 %
FEV6-%Pred-Pre: 105 %
FEV6-Post: 4.72 L
FEV6-Pre: 4.61 L
FEV6FVC-%Change-Post: 0 %
FEV6FVC-%Pred-Post: 104 %
FEV6FVC-%Pred-Pre: 104 %
FVC-%Change-Post: 2 %
FVC-%Pred-Post: 103 %
FVC-%Pred-Pre: 100 %
FVC-Post: 4.78 L
FVC-Pre: 4.65 L
Post FEV1/FVC ratio: 79 %
Post FEV6/FVC ratio: 99 %
Pre FEV1/FVC ratio: 80 %
Pre FEV6/FVC Ratio: 99 %
RV % pred: 99 %
RV: 2.33 L
TLC % pred: 100 %
TLC: 7.02 L

## 2022-03-11 NOTE — Patient Instructions (Signed)
Pulmonary fibrosis --Normal PFTs --ORDER HRCT without contrast in October 2023 --CONTINUE omeprazole 20 mg daily --Encourage regular aerobic exercise (e.g. 10,000 steps)  Variable extrathoracic F-V loops --Consider ENT evaluation if symptomatic in the future  Tobacco Abuse --Congratulations on quitting! --Uses nicotine toothpicks with success  Follow-up with me in 3 months

## 2022-03-11 NOTE — Progress Notes (Signed)
PFT done today. 

## 2022-03-11 NOTE — Progress Notes (Signed)
Subjective:   PATIENT ID: Julian Sullivan GENDER: male DOB: 14-Apr-1956, MRN: [id number]   HPI  Chief Complaint  Patient presents with   Follow-up    PFT results. Decreased smoking remarkably. Using nicotine infused toothpicks. CT was cancelled. Breathing doing pretty well. No SOB, wheezing, coughing.    Reason for Visit: Follow-up  Julian Sullivan is a 66 year old male with HTN, HLD and CAD who presents for follow-up.  08/18/20 His PCP recently ordered coronary calcium CT as part of his routine evaluation and was referred to Cardiology for abnormal CAC score and was started on high intensity statin. His CT also demonstrated suggestive of fibrosis so he was referred to Pulmonary for evaluation.  He reports that he Is an active male with no limitations in activity. Denies shortness of breath, cough, wheezing. No pleuritic chest pain, chest tightness. Previously smoked 1/2ppd however quit cigarette two months ago and now currently vapes including flavored products  06/12/21 Since our last visit, he quit vaping in May 2022 when he noticed he has had gradually worsening shortness of breath with activity. He is not active at [facility]. He is using zippix toothpicks. Denies wheezing or cough.  03/11/22 Denies shortness of breath, cough or wheezing. Has not needed albuterol and still using zippix toothpicks.  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 Quit 2 months ago. Quit vaping May 2022 Prior notes mention etOH use 2-3 drinks daily   Past Medical History:  Diagnosis Date   Abnormal LFTs    Back strain    Cervicalgia    Colon polyps    Colonic polyp    DDD (degenerative disc disease), cervical    Dermatitis    Dysuria    Dysuria    GERD (gastroesophageal reflux disease)    Hyperlipidemia    IFG (impaired fasting glucose)    Microalbuminuria    Neck pain    Neck pain     Obesity    Pain in joint, multiple sites    Proteinuria    Snoring    Snoring    Tachycardia    Tobacco abuse    Trigger finger    White coat syndrome with hypertension      Family History  Problem Relation Age of Onset   Heart attack Father      Social History   Occupational History   Not on file  Tobacco Use   Smoking status: Former    Packs/day: 0.50    Types: Cigarettes    Quit date: 06/20/2020    Years since quitting: 1.7   Smokeless tobacco: Never   Tobacco comments:    Stopped vaping 12/2020; 6-7 cigarettes/wk; currently using nicotine tooth picks  Vaping Use   Vaping Use: Every day  Substance and Sexual Activity   Alcohol use: Yes    Alcohol/week: 2.0 standard drinks of alcohol    Types: 2 Glasses of wine per week   Drug use: Not Currently   Sexual activity: Not on file    Allergies  Allergen Reactions   Fluvastatin    Percocet [Oxycodone-Acetaminophen] Other (See Comments)    Severe dreams     Outpatient Medications Prior to Visit  Medication Sig Dispense Refill   Acetaminophen (TYLENOL PO) Take 200 mg by mouth as needed. (Patient not taking: Reported on 03/11/2022)     albuterol (VENTOLIN HFA) 108 (90 Base) MCG/ACT inhaler Inhale 2 puffs into  the lungs every 6 (six) hours as needed for wheezing or shortness of breath. (Patient not taking: Reported on 03/11/2022) 8 g 2   ASPIRIN LOW DOSE 81 MG chewable tablet Chew 81 mg by mouth daily.  (Patient not taking: Reported on 03/11/2022)     buPROPion (WELLBUTRIN XL) 150 MG 24 hr tablet Take by mouth. (Patient not taking: Reported on 03/11/2022)     Cholecalciferol (VITAMIN D3) 125 MCG (5000 UT) TABS Take by mouth. (Patient not taking: Reported on 03/11/2022)     lisinopril (ZESTRIL) 10 MG tablet Take by mouth. (Patient not taking: Reported on 03/11/2022)     predniSONE (DELTASONE) 5 MG tablet Take 5 mg by mouth daily with breakfast. (Patient not taking: Reported on 03/11/2022)     REPATHA SURECLICK 140 MG/ML SOAJ  Inject 1 Syringe into the skin every 14 (fourteen) days. (Patient not taking: Reported on 03/11/2022)     TART CHERRY PO Take by mouth. (Patient not taking: Reported on 03/11/2022)     traMADol (ULTRAM) 50 MG tablet Take 1 tablet (50 mg total) by mouth every 12 (twelve) hours as needed. (Patient not taking: Reported on 03/11/2022) 30 tablet 2   TURMERIC PO Take by mouth. (Patient not taking: Reported on 03/11/2022)     No facility-administered medications prior to visit.    Review of Systems  Constitutional:  Negative for chills, diaphoresis, fever, malaise/fatigue and weight loss.  HENT:  Negative for congestion.   Respiratory:  Negative for cough, hemoptysis, sputum production, shortness of breath and wheezing.   Cardiovascular:  Negative for chest pain, palpitations and leg swelling.     Objective:   Vitals:   03/11/22 1426  BP: (!) 118/98  Pulse: 100  Temp: 98.2 F (36.8 C)  TempSrc: Oral  SpO2: 97%  Weight: 226 lb 3.2 oz (102.6 kg)  Height: 5\' 10"  (1.778 m)   SpO2: 97 %  Physical Exam: General: Well-appearing, no acute distress HENT: [place], AT Eyes: EOMI, no scleral icterus Respiratory: Faint bibasilar inspiratory crackles Cardiovascular: RRR, -M/R/G, no JVD Extremities:-Edema,-tenderness Neuro: AAO x4, CNII-XII grossly intact Psych: Normal mood, normal affect  Data Reviewed:  Imaging: CT Cardiac Scoring 06/12/20 - Diffuse subpleural reticulation with groundglass with prominent areas of interstitial thickening noted in the left anterior upper lung and right lung base.  PFT: 03/14/22 FVC 4.78 (103%) FEV1 3.78 (110%) Ratio 80  TLC 100% DLCO 100% Interpretation: No obstructive or restrictive defect present. F-V loops suggest variable extrathoracic obstruction. Clinically correlate. May need ENT evaluation.     Assessment & Plan:   Discussion: 66 year old male former smoker/vaper with pulmonary fibrosis of unknown etiology who presents for follow-up. He was previously  asymptomatic however returned with concerns for dyspnea in 2022. PFTs with normal function and now currently asymptomatic. F-V loops do suggest variable extrathoracic obstruction that could explain his intermittent dyspnea. Recommended referral to ENT however patient declined this visit as he is currently asymptomatic. We discussed continued surveillance of fibrosis including serial chest imaging and PFTs. Also recommend prophylactic PPI and early treatment of respiratory infection/flare to prevent progression of his lung disease  Pulmonary fibrosis --Normal PFTs --ORDER HRCT without contrast in October 2023 --CONTINUE omeprazole 20 mg daily --Encourage regular aerobic exercise (e.g. 10,000 steps)  Variable extrathoracic F-V loops --Consider ENT evaluation if symptomatic in the future  Tobacco Abuse --Congratulations on quitting! --Uses nicotine toothpicks with success   Health Maintenance Immunization History  Administered Date(s) Administered   PFIZER(Purple Top)SARS-COV-2 Vaccination 04/10/2020, 05/07/2020  CT Lung Screen - CT imaging as noted above  Orders Placed This Encounter  Procedures   CT Chest High Resolution    Standing Status:   Future    Standing Expiration Date:   03/12/2023    Scheduling Instructions:     Please schedule in October, 2023    Order Specific Question:   Preferred imaging location?    Answer:   [facility]   No orders of the defined types were placed in this encounter.   Return in about 3 months (around 06/11/2022).   I have spent a total time of 32-minutes on the day of the appointment including chart review, data review, collecting history, coordinating care and discussing medical diagnosis and plan with the patient/family. Past medical history, allergies, medications were reviewed. Pertinent imaging, labs and tests included in this note have been reviewed and interpreted independently by me.  [place], MD [facility] 03/11/2022 2:48 PM  Office Number [phone]

## 2022-03-14 ENCOUNTER — Encounter: Payer: Self-pay | Admitting: Pulmonary Disease

## 2022-04-28 DIAGNOSIS — M65872 Other synovitis and tenosynovitis, left ankle and foot: Secondary | ICD-10-CM | POA: Diagnosis not present

## 2022-04-28 DIAGNOSIS — M9901 Segmental and somatic dysfunction of cervical region: Secondary | ICD-10-CM | POA: Diagnosis not present

## 2022-04-28 DIAGNOSIS — M9902 Segmental and somatic dysfunction of thoracic region: Secondary | ICD-10-CM | POA: Diagnosis not present

## 2022-04-28 DIAGNOSIS — M5032 Other cervical disc degeneration, mid-cervical region, unspecified level: Secondary | ICD-10-CM | POA: Diagnosis not present

## 2022-04-28 DIAGNOSIS — M9903 Segmental and somatic dysfunction of lumbar region: Secondary | ICD-10-CM | POA: Diagnosis not present

## 2022-04-28 DIAGNOSIS — M5386 Other specified dorsopathies, lumbar region: Secondary | ICD-10-CM | POA: Diagnosis not present

## 2022-04-28 DIAGNOSIS — M65871 Other synovitis and tenosynovitis, right ankle and foot: Secondary | ICD-10-CM | POA: Diagnosis not present

## 2022-04-28 DIAGNOSIS — M19071 Primary osteoarthritis, right ankle and foot: Secondary | ICD-10-CM | POA: Diagnosis not present

## 2022-04-28 DIAGNOSIS — M19072 Primary osteoarthritis, left ankle and foot: Secondary | ICD-10-CM | POA: Diagnosis not present

## 2022-06-01 DIAGNOSIS — Z1212 Encounter for screening for malignant neoplasm of rectum: Secondary | ICD-10-CM | POA: Diagnosis not present

## 2022-06-01 DIAGNOSIS — R7989 Other specified abnormal findings of blood chemistry: Secondary | ICD-10-CM | POA: Diagnosis not present

## 2022-06-01 DIAGNOSIS — M5386 Other specified dorsopathies, lumbar region: Secondary | ICD-10-CM | POA: Diagnosis not present

## 2022-06-01 DIAGNOSIS — R7301 Impaired fasting glucose: Secondary | ICD-10-CM | POA: Diagnosis not present

## 2022-06-01 DIAGNOSIS — M9902 Segmental and somatic dysfunction of thoracic region: Secondary | ICD-10-CM | POA: Diagnosis not present

## 2022-06-01 DIAGNOSIS — E785 Hyperlipidemia, unspecified: Secondary | ICD-10-CM | POA: Diagnosis not present

## 2022-06-01 DIAGNOSIS — M9901 Segmental and somatic dysfunction of cervical region: Secondary | ICD-10-CM | POA: Diagnosis not present

## 2022-06-01 DIAGNOSIS — M5032 Other cervical disc degeneration, mid-cervical region, unspecified level: Secondary | ICD-10-CM | POA: Diagnosis not present

## 2022-06-01 DIAGNOSIS — Z125 Encounter for screening for malignant neoplasm of prostate: Secondary | ICD-10-CM | POA: Diagnosis not present

## 2022-06-01 DIAGNOSIS — I10 Essential (primary) hypertension: Secondary | ICD-10-CM | POA: Diagnosis not present

## 2022-06-01 DIAGNOSIS — M9903 Segmental and somatic dysfunction of lumbar region: Secondary | ICD-10-CM | POA: Diagnosis not present

## 2022-06-07 DIAGNOSIS — Z23 Encounter for immunization: Secondary | ICD-10-CM | POA: Diagnosis not present

## 2022-06-07 DIAGNOSIS — Z0000 Encounter for general adult medical examination without abnormal findings: Secondary | ICD-10-CM | POA: Diagnosis not present

## 2022-06-07 DIAGNOSIS — Z1331 Encounter for screening for depression: Secondary | ICD-10-CM | POA: Diagnosis not present

## 2022-06-07 DIAGNOSIS — I10 Essential (primary) hypertension: Secondary | ICD-10-CM | POA: Diagnosis not present

## 2022-06-07 DIAGNOSIS — E785 Hyperlipidemia, unspecified: Secondary | ICD-10-CM | POA: Diagnosis not present

## 2022-06-07 DIAGNOSIS — R7301 Impaired fasting glucose: Secondary | ICD-10-CM | POA: Diagnosis not present

## 2022-06-07 DIAGNOSIS — M2550 Pain in unspecified joint: Secondary | ICD-10-CM | POA: Diagnosis not present

## 2022-06-07 DIAGNOSIS — Z1339 Encounter for screening examination for other mental health and behavioral disorders: Secondary | ICD-10-CM | POA: Diagnosis not present

## 2022-06-07 DIAGNOSIS — M5030 Other cervical disc degeneration, unspecified cervical region: Secondary | ICD-10-CM | POA: Diagnosis not present

## 2022-06-07 DIAGNOSIS — I2510 Atherosclerotic heart disease of native coronary artery without angina pectoris: Secondary | ICD-10-CM | POA: Diagnosis not present

## 2022-06-07 DIAGNOSIS — M5412 Radiculopathy, cervical region: Secondary | ICD-10-CM | POA: Diagnosis not present

## 2022-06-07 DIAGNOSIS — R82998 Other abnormal findings in urine: Secondary | ICD-10-CM | POA: Diagnosis not present

## 2022-06-07 DIAGNOSIS — J8410 Pulmonary fibrosis, unspecified: Secondary | ICD-10-CM | POA: Diagnosis not present

## 2022-06-07 DIAGNOSIS — F17200 Nicotine dependence, unspecified, uncomplicated: Secondary | ICD-10-CM | POA: Diagnosis not present

## 2022-06-09 ENCOUNTER — Ambulatory Visit
Admission: RE | Admit: 2022-06-09 | Discharge: 2022-06-09 | Disposition: A | Discharge status: Home / Self Care | Payer: PPO | Source: Ambulatory Visit | Attending: Pulmonary Disease | Admitting: Pulmonary Disease

## 2022-06-09 DIAGNOSIS — R918 Other nonspecific abnormal finding of lung field: Secondary | ICD-10-CM | POA: Diagnosis not present

## 2022-06-09 DIAGNOSIS — J479 Bronchiectasis, uncomplicated: Secondary | ICD-10-CM | POA: Diagnosis not present

## 2022-06-09 DIAGNOSIS — I700 Atherosclerosis of aorta: Secondary | ICD-10-CM | POA: Diagnosis not present

## 2022-06-09 DIAGNOSIS — I2510 Atherosclerotic heart disease of native coronary artery without angina pectoris: Secondary | ICD-10-CM | POA: Diagnosis not present

## 2022-06-09 DIAGNOSIS — J8410 Pulmonary fibrosis, unspecified: Secondary | ICD-10-CM

## 2022-06-14 ENCOUNTER — Encounter: Payer: Self-pay | Admitting: Pulmonary Disease

## 2022-06-14 ENCOUNTER — Ambulatory Visit (INDEPENDENT_AMBULATORY_CARE_PROVIDER_SITE_OTHER): Payer: PPO | Admitting: Pulmonary Disease

## 2022-06-14 VITALS — BP 138/88 | HR 101 | Ht 70.0 in | Wt 229.0 lb

## 2022-06-14 DIAGNOSIS — J8410 Pulmonary fibrosis, unspecified: Secondary | ICD-10-CM | POA: Diagnosis not present

## 2022-06-14 NOTE — Progress Notes (Unsigned)
Subjective:   PATIENT ID: Julian Sullivan GENDER: male DOB: 1955/12/06, MRN: [id number]   HPI  Chief Complaint  Patient presents with   Follow-up    Feeling fine, just aches and pains    Reason for Visit: Follow-up  Julian Sullivan is a 66 year old male with HTN, HLD and CAD who presents for follow-up.  Initial consult/08/18/20 His PCP recently ordered coronary calcium CT as part of his routine evaluation and was referred to Cardiology for abnormal CAC score and was started on high intensity statin. His CT also demonstrated suggestive of fibrosis so he was referred to Pulmonary for evaluation.  He reports that he is an active male with no limitations in activity. Denies shortness of breath, cough, wheezing. No pleuritic chest pain, chest tightness. Previously smoked 1/2ppd however quit cigarette two months ago and now currently vapes including flavored products  06/12/21 Since our last visit, he quit vaping in May 2022 when he noticed he has had gradually worsening shortness of breath with activity. He is not active at [facility]. He is using zippix toothpicks. Denies wheezing or cough.  03/11/22 Denies shortness of breath, cough or wheezing. Has not needed albuterol and still using zippix toothpicks.  06/14/22 Denies shortness of breath, cough or wheezing. No rescue inhaler needed. Intermittently smokes 0-2 cigarettes daily. Still using zippix toothpicks. Has aches related to cholesterol medication.  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 Quit 2 months ago. Quit vaping May 2022 Prior notes mention etOH use 2-3 drinks daily   Past Medical History:  Diagnosis Date   Abnormal LFTs    Back strain    Cervicalgia    Colon polyps    Colonic polyp    DDD (degenerative disc disease), cervical    Dermatitis    Dysuria    Dysuria    GERD (gastroesophageal reflux disease)     Hyperlipidemia    IFG (impaired fasting glucose)    Microalbuminuria    Neck pain    Neck pain    Obesity    Pain in joint, multiple sites    Proteinuria    Snoring    Snoring    Tachycardia    Tobacco abuse    Trigger finger    White coat syndrome with hypertension      Family History  Problem Relation Age of Onset   Heart attack Father      Social History   Occupational History   Not on file  Tobacco Use   Smoking status: Some Days    Packs/day: 0.50    Types: Cigarettes    Last attempt to quit: 06/20/2020    Years since quitting: 1.9   Smokeless tobacco: Never   Tobacco comments:    Stopped vaping 12/2020; 6-7 cigarettes/wk; currently using nicotine tooth picks  Vaping Use   Vaping Use: Every day  Substance and Sexual Activity   Alcohol use: Yes    Alcohol/week: 2.0 standard drinks of alcohol    Types: 2 Glasses of wine per week   Drug use: Not Currently   Sexual activity: Not on file    Allergies  Allergen Reactions   Fluvastatin    Percocet [Oxycodone-Acetaminophen] Other (See Comments)    Severe dreams     Outpatient Medications Prior to Visit  Medication Sig Dispense Refill   buPROPion (WELLBUTRIN XL) 150 MG 24 hr tablet Take by mouth.  Cholecalciferol (VITAMIN D3) 125 MCG (5000 UT) TABS Take by mouth.     lisinopril (ZESTRIL) 10 MG tablet Take by mouth.     Acetaminophen (TYLENOL PO) Take 200 mg by mouth as needed.     albuterol (VENTOLIN HFA) 108 (90 Base) MCG/ACT inhaler Inhale 2 puffs into the lungs every 6 (six) hours as needed for wheezing or shortness of breath. 8 g 2   ASPIRIN LOW DOSE 81 MG chewable tablet Chew 81 mg by mouth daily.     predniSONE (DELTASONE) 5 MG tablet Take 5 mg by mouth daily with breakfast.     REPATHA SURECLICK 505 MG/ML SOAJ Inject 1 Syringe into the skin every 14 (fourteen) days.     TART CHERRY PO Take by mouth.     traMADol (ULTRAM) 50 MG tablet Take 1 tablet (50 mg total) by mouth every 12 (twelve) hours as  needed. 30 tablet 2   TURMERIC PO Take by mouth.     No facility-administered medications prior to visit.    Review of Systems  Constitutional:  Negative for chills, diaphoresis, fever, malaise/fatigue and weight loss.  HENT:  Negative for congestion.   Respiratory:  Negative for cough, hemoptysis, sputum production, shortness of breath and wheezing.   Cardiovascular:  Negative for chest pain, palpitations and leg swelling.     Objective:   Vitals:   06/14/22 1548  BP: 138/88  Pulse: (!) 101  SpO2: 98%  Weight: 229 lb (103.9 kg)  Height: 5\' 10"  (1.778 m)   SpO2: 98 % O2 Device: None (Room air)  Physical Exam: General: Well-appearing, no acute distress HENT: , AT Eyes: EOMI, no scleral icterus Respiratory: Faint bibasilar crackles Cardiovascular: RRR, -M/R/G, no JVD Extremities:-Edema,-tenderness Neuro: AAO x4, CNII-XII grossly intact Psych: Normal mood, normal affect  Data Reviewed:  Imaging: CT Cardiac Scoring 06/12/20 - Diffuse subpleural reticulation with groundglass with prominent areas of interstitial thickening noted in the left anterior upper lung and right lung base. CT Chest 06/11/22 - No progressive interstitial changes  PFT: 03/14/22 FVC 4.78 (103%) FEV1 3.78 (110%) Ratio 80  TLC 100% DLCO 100% Interpretation: No obstructive or restrictive defect present. F-V loops suggest variable extrathoracic obstruction. Clinically correlate. May need ENT evaluation.     Assessment & Plan:   Discussion: 66 year old male active smoker/vaper with pulmonary fibrosis of unknown etiology who presents for follow-up for fibrosis. Asymptomatic. Reviewed CT with no progression of parenchymal involvement. We discussed surveillance with chest imaging and PFTs. Discussed clinical course and management of ILD including PPI, infection prevention with hand washing, avoidance of sick contacts, vaccinations and action plan for exacerbation.  Fibrotic NSIP --Normal  PFTs --Reviewed CT with no progression --CONTINUE omeprazole 20 mg daily --Encourage regular aerobic exercise (e.g. 10,000 steps)  Variable extrathoracic F-V loops --Consider ENT evaluation if symptomatic in the future  Tobacco Abuse --Smoking 0-2 cigarettes daily --Uses nicotine toothpicks with improvement   Health Maintenance Immunization History  Administered Date(s) Administered   Fluad Quad(high Dose 65+) 06/07/2022   PFIZER(Purple Top)SARS-COV-2 Vaccination 04/10/2020, 05/07/2020   CT Lung Screen - CT imaging as noted above  Orders Placed This Encounter  Procedures   Pulmonary function test    Standing Status:   Future    Standing Expiration Date:   06/15/2023    Scheduling Instructions:     Completed July 2024    Order Specific Question:   Where should this test be performed?    Answer:   [facility]  Order Specific Question:   Full PFT: includes the following: basic spirometry, spirometry pre & post bronchodilator, diffusion capacity (DLCO), lung volumes    Answer:   Full PFT   No orders of the defined types were placed in this encounter.   Return in about 9 months (around 03/15/2023).   I have spent a total time of 32-minutes on the day of the appointment including chart review, data review, collecting history, coordinating care and discussing medical diagnosis and plan with the patient/family. Past medical history, allergies, medications were reviewed. Pertinent imaging, labs and tests included in this note have been reviewed and interpreted independently by me.  Thelma Viana Mechele Collin, MD [facility] 06/14/2022 4:03 PM  Office Number [phone]

## 2022-06-14 NOTE — Patient Instructions (Addendum)
Fibrotic NSIP ORDER pulmonary function test (July 2024)  Follow-up with me in July 2024 with PFTs prior to test

## 2022-06-16 ENCOUNTER — Other Ambulatory Visit (HOSPITAL_COMMUNITY): Payer: Self-pay

## 2022-06-17 ENCOUNTER — Ambulatory Visit (HOSPITAL_COMMUNITY)
Admission: RE | Admit: 2022-06-17 | Discharge: 2022-06-17 | Disposition: A | Discharge status: Home / Self Care | Payer: PPO | Source: Ambulatory Visit | Attending: Internal Medicine | Admitting: Internal Medicine

## 2022-06-17 DIAGNOSIS — I2510 Atherosclerotic heart disease of native coronary artery without angina pectoris: Secondary | ICD-10-CM | POA: Diagnosis not present

## 2022-06-17 DIAGNOSIS — E785 Hyperlipidemia, unspecified: Secondary | ICD-10-CM | POA: Diagnosis not present

## 2022-06-17 MED ORDER — INCLISIRAN SODIUM 284 MG/1.5ML ~~LOC~~ SOSY
PREFILLED_SYRINGE | SUBCUTANEOUS | Status: AC
  Filled 2022-06-17: qty 1.5

## 2022-06-17 MED ORDER — INCLISIRAN SODIUM 284 MG/1.5ML ~~LOC~~ SOSY
284.0000 mg | PREFILLED_SYRINGE | Freq: Once | SUBCUTANEOUS | Status: AC
  Administered 2022-06-17: 284 mg via SUBCUTANEOUS

## 2022-07-20 DIAGNOSIS — M65871 Other synovitis and tenosynovitis, right ankle and foot: Secondary | ICD-10-CM | POA: Diagnosis not present

## 2022-07-20 DIAGNOSIS — M19072 Primary osteoarthritis, left ankle and foot: Secondary | ICD-10-CM | POA: Diagnosis not present

## 2022-07-20 DIAGNOSIS — M19071 Primary osteoarthritis, right ankle and foot: Secondary | ICD-10-CM | POA: Diagnosis not present

## 2022-07-20 DIAGNOSIS — M65872 Other synovitis and tenosynovitis, left ankle and foot: Secondary | ICD-10-CM | POA: Diagnosis not present

## 2022-08-17 DIAGNOSIS — M5386 Other specified dorsopathies, lumbar region: Secondary | ICD-10-CM | POA: Diagnosis not present

## 2022-08-17 DIAGNOSIS — M9902 Segmental and somatic dysfunction of thoracic region: Secondary | ICD-10-CM | POA: Diagnosis not present

## 2022-08-17 DIAGNOSIS — M5032 Other cervical disc degeneration, mid-cervical region, unspecified level: Secondary | ICD-10-CM | POA: Diagnosis not present

## 2022-08-17 DIAGNOSIS — M9903 Segmental and somatic dysfunction of lumbar region: Secondary | ICD-10-CM | POA: Diagnosis not present

## 2022-08-17 DIAGNOSIS — M9901 Segmental and somatic dysfunction of cervical region: Secondary | ICD-10-CM | POA: Diagnosis not present

## 2022-09-17 ENCOUNTER — Inpatient Hospital Stay (HOSPITAL_COMMUNITY): Admission: RE | Admit: 2022-09-17 | Payer: PPO | Source: Ambulatory Visit

## 2023-01-19 DIAGNOSIS — F17200 Nicotine dependence, unspecified, uncomplicated: Secondary | ICD-10-CM | POA: Diagnosis not present

## 2023-01-19 DIAGNOSIS — R7301 Impaired fasting glucose: Secondary | ICD-10-CM | POA: Diagnosis not present

## 2023-01-19 DIAGNOSIS — J8410 Pulmonary fibrosis, unspecified: Secondary | ICD-10-CM | POA: Diagnosis not present

## 2023-01-19 DIAGNOSIS — E785 Hyperlipidemia, unspecified: Secondary | ICD-10-CM | POA: Diagnosis not present

## 2023-01-19 DIAGNOSIS — M5030 Other cervical disc degeneration, unspecified cervical region: Secondary | ICD-10-CM | POA: Diagnosis not present

## 2023-01-19 DIAGNOSIS — M21969 Unspecified acquired deformity of unspecified lower leg: Secondary | ICD-10-CM | POA: Diagnosis not present

## 2023-01-19 DIAGNOSIS — I2510 Atherosclerotic heart disease of native coronary artery without angina pectoris: Secondary | ICD-10-CM | POA: Diagnosis not present

## 2023-01-19 DIAGNOSIS — I10 Essential (primary) hypertension: Secondary | ICD-10-CM | POA: Diagnosis not present

## 2023-01-19 DIAGNOSIS — E118 Type 2 diabetes mellitus with unspecified complications: Secondary | ICD-10-CM | POA: Diagnosis not present

## 2023-05-11 DIAGNOSIS — Z1389 Encounter for screening for other disorder: Secondary | ICD-10-CM | POA: Diagnosis not present

## 2023-05-11 DIAGNOSIS — I2510 Atherosclerotic heart disease of native coronary artery without angina pectoris: Secondary | ICD-10-CM | POA: Diagnosis not present

## 2023-05-11 DIAGNOSIS — R9089 Other abnormal findings on diagnostic imaging of central nervous system: Secondary | ICD-10-CM | POA: Diagnosis not present

## 2023-05-11 DIAGNOSIS — I10 Essential (primary) hypertension: Secondary | ICD-10-CM | POA: Diagnosis not present

## 2023-05-11 DIAGNOSIS — E785 Hyperlipidemia, unspecified: Secondary | ICD-10-CM | POA: Diagnosis not present

## 2023-05-11 DIAGNOSIS — Z0000 Encounter for general adult medical examination without abnormal findings: Secondary | ICD-10-CM | POA: Diagnosis not present

## 2023-05-13 ENCOUNTER — Encounter (HOSPITAL_BASED_OUTPATIENT_CLINIC_OR_DEPARTMENT_OTHER): Payer: Self-pay | Admitting: Pulmonary Disease

## 2023-05-13 ENCOUNTER — Ambulatory Visit (HOSPITAL_BASED_OUTPATIENT_CLINIC_OR_DEPARTMENT_OTHER): Payer: PPO | Admitting: Pulmonary Disease

## 2023-05-13 VITALS — BP 166/82 | HR 85 | Resp 16 | Ht 70.0 in | Wt 227.0 lb

## 2023-05-13 DIAGNOSIS — J8410 Pulmonary fibrosis, unspecified: Secondary | ICD-10-CM

## 2023-05-13 NOTE — Progress Notes (Unsigned)
Subjective:   PATIENT ID: Julian Sullivan GENDER: male DOB: 1956/07/04, MRN: [id number]   HPI  Chief Complaint  Patient presents with   Follow-up    Reason for Visit: Follow-up  [name] is a 67 year old male with HTN, HLD and CAD who presents for follow-up.  Initial consult/08/18/20 His PCP recently ordered coronary calcium CT as part of his routine evaluation and was referred to Cardiology for abnormal CAC score and was started on high intensity statin. His CT also demonstrated suggestive of fibrosis so he was referred to Pulmonary for evaluation.  He reports that he is an active male with no limitations in activity. Denies shortness of breath, cough, wheezing. No pleuritic chest pain, chest tightness. Previously smoked 1/2ppd however quit cigarette two months ago and now currently vapes including flavored products  06/12/21 Since our last visit, he quit vaping in May 2022 when he noticed he has had gradually worsening shortness of breath with activity. He is not active at [facility]. He is using zippix toothpicks. Denies wheezing or cough.  03/11/22 Denies shortness of breath, cough or wheezing. Has not needed albuterol and still using zippix toothpicks.  06/14/22 Denies shortness of breath, cough or wheezing. No rescue inhaler needed. Intermittently smokes 0-2 cigarettes daily. Still using zippix toothpicks. Has aches related to cholesterol medication.  05/03/23 Since our last visit he reports he has overall done well. Denies shortness of breath or wheezing. Smokes 0-2 cigarettes daily. Using zippix toothpick and pouch. Activity includes daily walking. 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 Quit 2 months ago. Quit vaping May 2022 Prior notes mention etOH use 2-3 drinks daily   Past Medical History:  Diagnosis Date    Abnormal LFTs    Back strain    Cervicalgia    Colon polyps    Colonic polyp    DDD (degenerative disc disease), cervical    Dermatitis    Dysuria    Dysuria    GERD (gastroesophageal reflux disease)    Hyperlipidemia    IFG (impaired fasting glucose)    Microalbuminuria    Neck pain    Neck pain    Obesity    Pain in joint, multiple sites    Proteinuria    Snoring    Snoring    Tachycardia    Tobacco abuse    Trigger finger    White coat syndrome with hypertension      Family History  Problem Relation Age of Onset   Heart attack Father      Social History   Occupational History   Not on file  Tobacco Use   Smoking status: Some Days    Current packs/day: 0.00    Types: Cigarettes    Last attempt to quit: 06/20/2020    Years since quitting: 2.8   Smokeless tobacco: Never   Tobacco comments:    Stopped vaping 12/2020; 6-7 cigarettes/wk; currently using nicotine tooth picks  Vaping Use   Vaping status: Every Day  Substance and Sexual Activity   Alcohol use: Yes    Alcohol/week: 2.0 standard drinks of alcohol    Types: 2 Glasses of wine per week   Drug use: Not Currently   Sexual activity: Not on file    Allergies  Allergen Reactions   Fluvastatin    Percocet [Oxycodone-Acetaminophen] Other (See Comments)  Severe dreams     Outpatient Medications Prior to Visit  Medication Sig Dispense Refill   aspirin 325 MG tablet Take 325 mg by mouth daily.     Cholecalciferol (VITAMIN D3) 125 MCG (5000 UT) TABS Take by mouth.     co-enzyme Q-10 30 MG capsule Take 30 mg by mouth 3 (three) times daily.     lisinopril (ZESTRIL) 10 MG tablet Take by mouth.     PRALUENT 150 MG/ML SOAJ Inject 150 mg into the skin. Every 2-3 weeks     omeprazole (PRILOSEC) 20 MG capsule Take 1 capsule (20 mg total) by mouth daily. (Patient not taking: Reported on 05/13/2023) 30 capsule    buPROPion (WELLBUTRIN XL) 150 MG 24 hr tablet Take by mouth.     No facility-administered  medications prior to visit.    Review of Systems  Constitutional:  Negative for chills, diaphoresis, fever, malaise/fatigue and weight loss.  HENT:  Negative for congestion.   Respiratory:  Negative for cough, hemoptysis, sputum production, shortness of breath and wheezing.   Cardiovascular:  Negative for chest pain, palpitations and leg swelling.     Objective:   Vitals:   05/13/23 1542  BP: (!) 166/82  Pulse: 85  Resp: 16  SpO2: 98%  Weight: 227 lb (103 kg)  Height: 5\' 10"  (1.778 m)   SpO2: 98 %  Physical Exam: General: Well-appearing, no acute distress HENT: [place], AT Eyes: EOMI, no scleral icterus Respiratory: Faint bibasilar crackles Cardiovascular: RRR, -M/R/G, no JVD Extremities:-Edema,-tenderness Neuro: AAO x4, CNII-XII grossly intact Psych: Normal mood, normal affect  Data Reviewed:  Imaging: CT Cardiac Scoring 06/12/20 - Diffuse subpleural reticulation with groundglass with prominent areas of interstitial thickening noted in the left anterior upper lung and right lung base. CT Chest 06/11/22 - No progressive interstitial changes  PFT: 03/14/22 FVC 4.78 (103%) FEV1 3.78 (110%) Ratio 80  TLC 100% DLCO 100% Interpretation: No obstructive or restrictive defect present. F-V loops suggest variable extrathoracic obstruction. Clinically correlate. May need ENT evaluation.     Assessment & Plan:   Discussion: 67 year old male active smoker/vaper with pulmonary fibrosis of unknown etiology who presents for follow-up for fibrosis. Asymptomatic. Reviewed CT with no progression of parenchymal involvement. We discussed surveillance with chest imaging and PFTs. Discussed clinical course and management of ILD including PPI, infection prevention with hand washing, avoidance of sick contacts, vaccinations and action plan for exacerbation.  Fibrotic NSIP --Normal PFTs --Reviewed CT with no progression --CONTINUE omeprazole 20 mg daily --Encourage regular aerobic exercise at  [employer] --ORDER pulmonary function test in 6 months  Variable extrathoracic F-V loops --Consider ENT evaluation if symptomatic in the future  Tobacco Abuse --Smoking 0-2 cigarettes daily --Uses nicotine toothpicks with improvement --Quit smoking   Health Maintenance Immunization History  Administered Date(s) Administered   Fluad Quad(high Dose 65+) 06/07/2022   PFIZER(Purple Top)SARS-COV-2 Vaccination 04/10/2020, 05/07/2020   CT Lung Screen - CT imaging as noted above  No orders of the defined types were placed in this encounter.  No orders of the defined types were placed in this encounter.   No follow-ups on file.   I have spent a total time of 32-minutes on the day of the appointment including chart review, data review, collecting history, coordinating care and discussing medical diagnosis and plan with the patient/family. Past medical history, allergies, medications were reviewed. Pertinent imaging, labs and tests included in this note have been reviewed and interpreted independently by me.  Keshon Markovitz Mechele Collin, MD [facility] 05/13/2023 3:56 PM  Office Number [phone]

## 2023-05-13 NOTE — Patient Instructions (Addendum)
Fibrotic NSIP --Schedule pulmonary function test when next available. Will call with results

## 2023-05-16 DIAGNOSIS — Z1211 Encounter for screening for malignant neoplasm of colon: Secondary | ICD-10-CM | POA: Diagnosis not present

## 2023-05-16 DIAGNOSIS — I10 Essential (primary) hypertension: Secondary | ICD-10-CM | POA: Diagnosis not present

## 2023-05-17 ENCOUNTER — Encounter (HOSPITAL_BASED_OUTPATIENT_CLINIC_OR_DEPARTMENT_OTHER): Payer: Self-pay | Admitting: Pulmonary Disease

## 2023-05-25 ENCOUNTER — Encounter (HOSPITAL_BASED_OUTPATIENT_CLINIC_OR_DEPARTMENT_OTHER): Payer: Self-pay | Admitting: Pulmonary Disease

## 2023-05-25 ENCOUNTER — Ambulatory Visit (HOSPITAL_BASED_OUTPATIENT_CLINIC_OR_DEPARTMENT_OTHER): Payer: PPO | Admitting: Pulmonary Disease

## 2023-05-25 VITALS — BP 140/84 | HR 72 | Resp 16 | Ht 71.0 in | Wt 227.6 lb

## 2023-05-25 DIAGNOSIS — J849 Interstitial pulmonary disease, unspecified: Secondary | ICD-10-CM | POA: Diagnosis not present

## 2023-05-25 DIAGNOSIS — J8410 Pulmonary fibrosis, unspecified: Secondary | ICD-10-CM

## 2023-05-25 LAB — PULMONARY FUNCTION TEST
DL/VA % pred: 97 %
DL/VA: 4.01 ml/min/mmHg/L
DLCO cor % pred: 97 %
DLCO cor: 26.05 ml/min/mmHg
DLCO unc % pred: 97 %
DLCO unc: 26.05 ml/min/mmHg
FEF 25-75 Post: 2.88 L/sec
FEF 25-75 Pre: 3.02 L/sec
FEF2575-%Change-Post: -4 %
FEF2575-%Pred-Post: 107 %
FEF2575-%Pred-Pre: 113 %
FEV1-%Change-Post: 0 %
FEV1-%Pred-Post: 102 %
FEV1-%Pred-Pre: 102 %
FEV1-Post: 3.49 L
FEV1-Pre: 3.48 L
FEV1FVC-%Change-Post: 0 %
FEV1FVC-%Pred-Pre: 103 %
FEV6-%Change-Post: 1 %
FEV6-%Pred-Post: 103 %
FEV6-%Pred-Pre: 102 %
FEV6-Post: 4.48 L
FEV6-Pre: 4.44 L
FEV6FVC-%Change-Post: 0 %
FEV6FVC-%Pred-Post: 104 %
FEV6FVC-%Pred-Pre: 104 %
FVC-%Change-Post: 0 %
FVC-%Pred-Post: 98 %
FVC-%Pred-Pre: 98 %
FVC-Post: 4.52 L
FVC-Pre: 4.52 L
Post FEV1/FVC ratio: 77 %
Post FEV6/FVC ratio: 99 %
Pre FEV1/FVC ratio: 77 %
Pre FEV6/FVC Ratio: 99 %
RV % pred: 110 %
RV: 2.62 L
TLC % pred: 102 %
TLC: 7.22 L

## 2023-05-25 NOTE — Progress Notes (Signed)
Subjective:   PATIENT ID: Julian Sullivan GENDER: male DOB: 04/18/1956, MRN: [id number]   HPI  No chief complaint on file.   Reason for Visit: Follow-up  [name] is a 67 year old male with HTN, HLD and CAD who presents for follow-up.  Initial consult/08/18/20 His PCP recently ordered coronary calcium CT as part of his routine evaluation and was referred to Cardiology for abnormal CAC score and was started on high intensity statin. His CT also demonstrated suggestive of fibrosis so he was referred to Pulmonary for evaluation.  He reports that he is an active male with no limitations in activity. Denies shortness of breath, cough, wheezing. No pleuritic chest pain, chest tightness. Previously smoked 1/2ppd however quit cigarette two months ago and now currently vapes including flavored products  06/12/21 Since our last visit, he quit vaping in May 2022 when he noticed he has had gradually worsening shortness of breath with activity. He is not active at [employer]. He is using zippix toothpicks. Denies wheezing or cough.  03/11/22 Denies shortness of breath, cough or wheezing. Has not needed albuterol and still using zippix toothpicks.  06/14/22 Denies shortness of breath, cough or wheezing. No rescue inhaler needed. Intermittently smokes 0-2 cigarettes daily. Still using zippix toothpicks. Has aches related to cholesterol medication.  05/03/23 Since our last visit he reports he has overall done well. Denies shortness of breath or wheezing. Smokes 0-2 cigarettes daily. Using zippix toothpick and pouch. Activity includes daily walking. Went to [employer] with elliptical for 15 min and curls  05/25/23 Since our last visit he reports shortness of breath when bending over but able to walk long distances. Continues to smoke 0-2 cigarettes daily.  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  Quit 2 months ago. Quit vaping May 2022 Prior notes mention etOH use 2-3 drinks daily   Past Medical History:  Diagnosis Date   Abnormal LFTs    Back strain    Cervicalgia    Colon polyps    Colonic polyp    DDD (degenerative disc disease), cervical    Dermatitis    Dysuria    Dysuria    GERD (gastroesophageal reflux disease)    Hyperlipidemia    IFG (impaired fasting glucose)    Microalbuminuria    Neck pain    Neck pain    Obesity    Pain in joint, multiple sites    Proteinuria    Snoring    Snoring    Tachycardia    Tobacco abuse    Trigger finger    White coat syndrome with hypertension      Family History  Problem Relation Age of Onset   Heart attack Father      Social History   Occupational History   Not on file  Tobacco Use   Smoking status: Some Days    Current packs/day: 0.00    Types: Cigarettes    Last attempt to quit: 06/20/2020    Years since quitting: 2.9   Smokeless tobacco: Never   Tobacco comments:    Stopped vaping 12/2020; 6-7 cigarettes/wk; currently using nicotine tooth picks  Vaping Use   Vaping status: Every Day  Substance and Sexual Activity   Alcohol use: Yes    Alcohol/week: 2.0 standard drinks of alcohol    Types: 2 Glasses of wine per week   Drug use: Not Currently   Sexual activity: Not  on file    Allergies  Allergen Reactions   Fluvastatin    Percocet [Oxycodone-Acetaminophen] Other (See Comments)    Severe dreams     Outpatient Medications Prior to Visit  Medication Sig Dispense Refill   aspirin 325 MG tablet Take 325 mg by mouth daily.     Cholecalciferol (VITAMIN D3) 125 MCG (5000 UT) TABS Take by mouth.     co-enzyme Q-10 30 MG capsule Take 30 mg by mouth 3 (three) times daily.     lisinopril (ZESTRIL) 10 MG tablet Take by mouth.     PRALUENT 150 MG/ML SOAJ Inject 150 mg into the skin. Every 2-3 weeks     omeprazole (PRILOSEC) 20 MG capsule Take 20 mg by mouth daily. (Patient not taking: Reported on 05/25/2023)  30 capsule    No facility-administered medications prior to visit.    Review of Systems  Constitutional:  Negative for chills, diaphoresis, fever, malaise/fatigue and weight loss.  HENT:  Negative for congestion.   Respiratory:  Negative for cough, hemoptysis, sputum production, shortness of breath and wheezing.   Cardiovascular:  Negative for chest pain, palpitations and leg swelling.     Objective:   Vitals:   05/25/23 1627  BP: (!) 140/84  Pulse: 72  Resp: 16  SpO2: 97%  Weight: 227 lb 9.6 oz (103.2 kg)  Height: 5\' 11"  (1.803 m)    SpO2: 97 %  Physical Exam: General: Well-appearing, no acute distress HENT: [place], AT Eyes: EOMI, no scleral icterus Respiratory: Clear to auscultation bilaterally.  No crackles, wheezing or rales Cardiovascular: RRR, -M/R/G, no JVD Extremities:-Edema,-tenderness Neuro: AAO x4, CNII-XII grossly intact Psych: Normal mood, normal affect  Data Reviewed:  Imaging: CT Cardiac Scoring 06/12/20 - Diffuse subpleural reticulation with groundglass with prominent areas of interstitial thickening noted in the left anterior upper lung and right lung base. CT Chest 06/11/22 - No progressive interstitial changes  PFT: 03/11/22 FVC 4.78 (103%) FEV1 3.78 (110%) Ratio 80  TLC 100% DLCO 100% Interpretation: No obstructive or restrictive defect present. F-V loops suggest variable extrathoracic obstruction. Clinically correlate. May need ENT evaluation.  05/25/23 FVC 4.52 (98%) FEV1 3.49 (102%) Ratio 77  TLC 102% DLCO 97% Interpretation: Normal pulmonary function tests     Assessment & Plan:   Discussion: 67 year old male active smoker/vaper with pulmonary fibrosis of unknown etiology for follow-up for fibrosis. Asymptomatic. PFTs reviewed and stable. Will plan for CT Chest in 6 months and PFT in 1 year. Discussed clinical course and management of ILD including bronchodilator regimen if warranted, preventive care, smoking cessation, PPI, aerobic exercise  and action plan for exacerbation.  Fibrotic NSIP --Normal PFTs today --CT 05/2022 with no progression compared to 05/2020 --ORDER CT Chest without contrast in 6 months (March 2025) --CONTINUE omeprazole 20 mg daily --Encourage regular aerobic exercise at [employer] --Will need pulmonary function test in 1 year. Order at next visit  Variable extrathoracic F-V loops --Consider ENT evaluation if symptomatic in the future  Tobacco Abuse --Smoking 0-2 cigarettes daily --Uses nicotine toothpicks with improvement --Quit smoking   Health Maintenance Immunization History  Administered Date(s) Administered   Fluad Quad(high Dose 65+) 06/07/2022   PFIZER(Purple Top)SARS-COV-2 Vaccination 04/10/2020, 05/07/2020   CT Lung Screen - CT imaging as noted above  Orders Placed This Encounter  Procedures   CT Chest Wo Contrast    Standing Status:   Future    Standing Expiration Date:   05/24/2024    Scheduling Instructions:     Schedule in  6 months in March 2025    Order Specific Question:   Preferred imaging location?    Answer:   [facility]   No orders of the defined types were placed in this encounter.   Return in about 6 months (around 11/22/2023) for after CT scan.   I have spent a total time of 20-minutes on the day of the appointment including chart review, data review, collecting history, coordinating care and discussing medical diagnosis and plan with the patient/family. Past medical history, allergies, medications were reviewed. Pertinent imaging, labs and tests included in this note have been reviewed and interpreted independently by me.   Veronica Fretz Mechele Collin, MD [facility] 05/25/2023 4:54 PM  Office Number [phone]

## 2023-05-25 NOTE — Patient Instructions (Signed)
Fibrotic NSIP --Normal PFTs today --CT 05/2022 with no progression compared to 05/2020 --ORDER CT Chest without contrast in 6 months (March 2025) --CONTINUE omeprazole 20 mg daily --Encourage regular aerobic exercise at [employer] --Will need pulmonary function test in 1 year. Order at next visit

## 2023-05-25 NOTE — Patient Instructions (Signed)
Full PFT Performed Today

## 2023-05-25 NOTE — Progress Notes (Signed)
Full PFT Performed Today  

## 2023-06-23 DIAGNOSIS — D123 Benign neoplasm of transverse colon: Secondary | ICD-10-CM | POA: Diagnosis not present

## 2023-06-23 DIAGNOSIS — Z1211 Encounter for screening for malignant neoplasm of colon: Secondary | ICD-10-CM | POA: Diagnosis not present

## 2023-06-23 DIAGNOSIS — K5730 Diverticulosis of large intestine without perforation or abscess without bleeding: Secondary | ICD-10-CM | POA: Diagnosis not present

## 2023-06-23 DIAGNOSIS — Z860101 Personal history of adenomatous and serrated colon polyps: Secondary | ICD-10-CM | POA: Diagnosis not present

## 2023-06-23 DIAGNOSIS — K635 Polyp of colon: Secondary | ICD-10-CM | POA: Diagnosis not present

## 2023-07-26 DIAGNOSIS — I2510 Atherosclerotic heart disease of native coronary artery without angina pectoris: Secondary | ICD-10-CM | POA: Diagnosis not present

## 2023-07-26 DIAGNOSIS — Z1212 Encounter for screening for malignant neoplasm of rectum: Secondary | ICD-10-CM | POA: Diagnosis not present

## 2023-07-26 DIAGNOSIS — E118 Type 2 diabetes mellitus with unspecified complications: Secondary | ICD-10-CM | POA: Diagnosis not present

## 2023-07-26 DIAGNOSIS — R7989 Other specified abnormal findings of blood chemistry: Secondary | ICD-10-CM | POA: Diagnosis not present

## 2023-07-26 DIAGNOSIS — Z79899 Other long term (current) drug therapy: Secondary | ICD-10-CM | POA: Diagnosis not present

## 2023-07-26 DIAGNOSIS — E785 Hyperlipidemia, unspecified: Secondary | ICD-10-CM | POA: Diagnosis not present

## 2023-07-26 DIAGNOSIS — I10 Essential (primary) hypertension: Secondary | ICD-10-CM | POA: Diagnosis not present

## 2023-07-26 DIAGNOSIS — Z125 Encounter for screening for malignant neoplasm of prostate: Secondary | ICD-10-CM | POA: Diagnosis not present

## 2023-08-09 DIAGNOSIS — R0683 Snoring: Secondary | ICD-10-CM | POA: Diagnosis not present

## 2023-08-09 DIAGNOSIS — Z0000 Encounter for general adult medical examination without abnormal findings: Secondary | ICD-10-CM | POA: Diagnosis not present

## 2023-08-09 DIAGNOSIS — Z1339 Encounter for screening examination for other mental health and behavioral disorders: Secondary | ICD-10-CM | POA: Diagnosis not present

## 2023-08-09 DIAGNOSIS — Z1331 Encounter for screening for depression: Secondary | ICD-10-CM | POA: Diagnosis not present

## 2023-08-09 DIAGNOSIS — J8410 Pulmonary fibrosis, unspecified: Secondary | ICD-10-CM | POA: Diagnosis not present

## 2023-08-09 DIAGNOSIS — D126 Benign neoplasm of colon, unspecified: Secondary | ICD-10-CM | POA: Diagnosis not present

## 2023-08-09 DIAGNOSIS — I2510 Atherosclerotic heart disease of native coronary artery without angina pectoris: Secondary | ICD-10-CM | POA: Diagnosis not present

## 2023-08-09 DIAGNOSIS — Z23 Encounter for immunization: Secondary | ICD-10-CM | POA: Diagnosis not present

## 2023-08-09 DIAGNOSIS — E669 Obesity, unspecified: Secondary | ICD-10-CM | POA: Diagnosis not present

## 2023-08-09 DIAGNOSIS — I10 Essential (primary) hypertension: Secondary | ICD-10-CM | POA: Diagnosis not present

## 2023-08-09 DIAGNOSIS — E118 Type 2 diabetes mellitus with unspecified complications: Secondary | ICD-10-CM | POA: Diagnosis not present

## 2023-08-09 DIAGNOSIS — E785 Hyperlipidemia, unspecified: Secondary | ICD-10-CM | POA: Diagnosis not present

## 2023-08-09 DIAGNOSIS — R82998 Other abnormal findings in urine: Secondary | ICD-10-CM | POA: Diagnosis not present

## 2023-09-19 ENCOUNTER — Telehealth: Payer: Self-pay | Admitting: Pulmonary Disease

## 2023-09-20 NOTE — Telephone Encounter (Signed)
NFN 

## 2023-09-21 ENCOUNTER — Telehealth (HOSPITAL_BASED_OUTPATIENT_CLINIC_OR_DEPARTMENT_OTHER): Payer: Self-pay | Admitting: Pulmonary Disease

## 2023-09-21 NOTE — Telephone Encounter (Signed)
Patient states he is needing a prior auth for CT scan scheduled for 11/07/23 please advise and call patient back.

## 2023-11-07 ENCOUNTER — Ambulatory Visit (HOSPITAL_BASED_OUTPATIENT_CLINIC_OR_DEPARTMENT_OTHER): Payer: PPO

## 2023-11-07 ENCOUNTER — Ambulatory Visit (HOSPITAL_BASED_OUTPATIENT_CLINIC_OR_DEPARTMENT_OTHER)
Admission: RE | Admit: 2023-11-07 | Discharge: 2023-11-07 | Disposition: A | Discharge status: Home / Self Care | Payer: PPO | Source: Ambulatory Visit | Attending: Pulmonary Disease | Admitting: Pulmonary Disease

## 2023-11-07 DIAGNOSIS — J849 Interstitial pulmonary disease, unspecified: Secondary | ICD-10-CM | POA: Diagnosis not present

## 2023-11-07 DIAGNOSIS — R590 Localized enlarged lymph nodes: Secondary | ICD-10-CM | POA: Diagnosis not present

## 2023-11-07 DIAGNOSIS — I700 Atherosclerosis of aorta: Secondary | ICD-10-CM | POA: Diagnosis not present

## 2023-11-17 ENCOUNTER — Encounter (HOSPITAL_BASED_OUTPATIENT_CLINIC_OR_DEPARTMENT_OTHER): Payer: Self-pay | Admitting: Pulmonary Disease

## 2023-11-17 ENCOUNTER — Ambulatory Visit (HOSPITAL_BASED_OUTPATIENT_CLINIC_OR_DEPARTMENT_OTHER): Payer: PPO | Admitting: Pulmonary Disease

## 2023-11-17 VITALS — BP 122/80 | HR 101 | Ht 71.0 in | Wt 225.6 lb

## 2023-11-17 DIAGNOSIS — J8489 Other specified interstitial pulmonary diseases: Secondary | ICD-10-CM

## 2023-11-17 NOTE — Progress Notes (Unsigned)
 Subjective:   PATIENT ID: Julian Sullivan GENDER: male DOB: [birth year], MRN: [id number]   HPI  Chief Complaint  Patient presents with   Follow-up    Pulmonary fibrosis    Reason for Visit: Follow-up  Mr. Josep Luviano is a 68 year old male with HTN, HLD and CAD who presents for follow-up.  Initial consult/08/18/20 His PCP recently ordered coronary calcium CT as part of his routine evaluation and was referred to Cardiology for abnormal CAC score and was started on high intensity statin. His CT also demonstrated suggestive of fibrosis so he was referred to Pulmonary for evaluation.  He reports that he is an active male with no limitations in activity. Denies shortness of breath, cough, wheezing. No pleuritic chest pain, chest tightness. Previously smoked 1/2ppd however quit cigarette two months ago and now currently vapes including flavored products  06/12/21 Since our last visit, he quit vaping in May 2022 when he noticed he has had gradually worsening shortness of breath with activity. He is not active at [employer]. He is using zippix toothpicks. Denies wheezing or cough.  03/11/22 Denies shortness of breath, cough or wheezing. Has not needed albuterol and still using zippix toothpicks.  06/14/22 Denies shortness of breath, cough or wheezing. No rescue inhaler needed. Intermittently smokes 0-2 cigarettes daily. Still using zippix toothpicks. Has aches related to cholesterol medication.  05/03/23 Since our last visit he reports he has overall done well. Denies shortness of breath or wheezing. Smokes 0-2 cigarettes daily. Using zippix toothpick and pouch. Activity includes daily walking. Went to [employer] with elliptical for 15 min and curls  05/25/23 Since our last visit he reports shortness of breath when bending over but able to walk long distances. Continues to smoke 0-2 cigarettes daily.  11/17/23 Since our last visit unchanged shortness of breath with activity. Sedentary at  baseline due to busy work schedule.   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 Quit 2 months ago. Quit vaping May 2022 Prior notes mention etOH use 2-3 drinks daily   Past Medical History:  Diagnosis Date   Abnormal LFTs    Back strain    Cervicalgia    Colon polyps    Colonic polyp    DDD (degenerative disc disease), cervical    Dermatitis    Dysuria    Dysuria    GERD (gastroesophageal reflux disease)    Hyperlipidemia    IFG (impaired fasting glucose)    Microalbuminuria    Neck pain    Neck pain    Obesity    Pain in joint, multiple sites    Proteinuria    Snoring    Snoring    Tachycardia    Tobacco abuse    Trigger finger    White coat syndrome with hypertension      Family History  Problem Relation Age of Onset   Heart attack Father      Social History   Occupational History   Not on file  Tobacco Use   Smoking status: Some Days    Current packs/day: 0.00    Types: Cigarettes    Last attempt to quit: 06/20/2020    Years since quitting: 3.4   Smokeless tobacco: Never   Tobacco comments:    Stopped vaping 12/2020; 6-7 cigarettes/wk; currently using nicotine tooth picks  Vaping Use   Vaping status: Every Day  Substance and Sexual Activity   Alcohol use:  Yes    Alcohol/week: 2.0 standard drinks of alcohol    Types: 2 Glasses of wine per week   Drug use: Not Currently   Sexual activity: Not on file    Allergies  Allergen Reactions   Fluvastatin    Percocet [Oxycodone-Acetaminophen] Other (See Comments)    Severe dreams     Outpatient Medications Prior to Visit  Medication Sig Dispense Refill   aspirin 325 MG tablet Take 325 mg by mouth daily.     Cholecalciferol (VITAMIN D3) 125 MCG (5000 UT) TABS Take by mouth.     cimetidine (TAGAMET) 200 MG tablet Take 200 mg by mouth daily.     co-enzyme Q-10 30 MG capsule Take 30 mg by mouth 3 (three)  times daily.     lisinopril (ZESTRIL) 10 MG tablet Take by mouth.     PRALUENT 150 MG/ML SOAJ Inject 150 mg into the skin. Every 2-3 weeks     omeprazole (PRILOSEC) 20 MG capsule Take 20 mg by mouth daily. (Patient not taking: Reported on 11/17/2023) 30 capsule    No facility-administered medications prior to visit.    Review of Systems  Constitutional:  Negative for chills, diaphoresis, fever, malaise/fatigue and weight loss.  HENT:  Negative for congestion.   Respiratory:  Positive for shortness of breath. Negative for cough, hemoptysis, sputum production and wheezing.   Cardiovascular:  Negative for chest pain, palpitations and leg swelling.     Objective:   Vitals:   11/17/23 1535  BP: 122/80  Pulse: (!) 101  SpO2: 98%  Weight: 225 lb 9.6 oz (102.3 kg)  Height: 5\' 11"  (1.803 m)    SpO2: 98 %  Physical Exam: General: Well-appearing, no acute distress HENT: [place], AT Eyes: EOMI, no scleral icterus Respiratory: ***Clear to auscultation bilaterally.  No crackles, wheezing or rales Cardiovascular: RRR, -M/R/G, no JVD Extremities:-Edema,-tenderness Neuro: AAO x4, CNII-XII grossly intact Psych: Normal mood, normal affect  Data Reviewed:  Imaging: CT Cardiac Scoring 06/12/20 - Diffuse subpleural reticulation with groundglass with prominent areas of interstitial thickening noted in the left anterior upper lung and right lung base. CT Chest 06/11/22 - No progressive interstitial changes  PFT: 03/11/22 FVC 4.78 (103%) FEV1 3.78 (110%) Ratio 80  TLC 100% DLCO 100% Interpretation: No obstructive or restrictive defect present. F-V loops suggest variable extrathoracic obstruction. Clinically correlate. May need ENT evaluation.  05/25/23 FVC 4.52 (98%) FEV1 3.49 (102%) Ratio 77  TLC 102% DLCO 97% Interpretation: Normal pulmonary function tests     Assessment & Plan:   Discussion: 68 year old male active smoker/vaper with pulmonary fibrosis of unknown etiology for follow-up for  fibrosis. Asymptomatic. PFTs reviewed and stable. Will plan for CT Chest in 6 months and PFT in 1 year. Discussed clinical course and management of ILD including bronchodilator regimen if warranted, preventive care, smoking cessation, PPI, aerobic exercise and action plan for exacerbation.  Fibrotic NSIP --Normal PFTs  --Reviewed CT 11/07/23 with stable ILD, minimally progressed since 05/2020 --CONTINUE omeprazole 20 mg daily --Encourage regular aerobic exercise at home or [facility] --Plan for pulmonary function test in 1 year (10/2024)  Variable extrathoracic F-V loops --Consider ENT evaluation if symptomatic in the future  Tobacco Abuse --Smoking 0-2 cigarettes daily --Uses nicotine toothpicks with improvement --Quit smoking   Health Maintenance Immunization History  Administered Date(s) Administered   Fluad Quad(high Dose 65+) 06/07/2022   PFIZER(Purple Top)SARS-COV-2 Vaccination 04/10/2020, 05/07/2020   CT Lung Screen - CT imaging as noted above  Orders Placed This Encounter  Procedures   Pulmonary function test    Standing Status:   Future    Expiration Date:   11/16/2024    Where should this test be performed?:   [facility]    Full PFT: includes the following: basic spirometry, spirometry pre & post bronchodilator, diffusion capacity (DLCO), lung volumes:   Full PFT   No orders of the defined types were placed in this encounter.   Return in about 1 year (around 11/16/2024) for after PFT.   I have spent a total time of 30-minutes on the day of the appointment including chart review, data review, collecting history, coordinating care and discussing medical diagnosis and plan with the patient/family. Past medical history, allergies, medications were reviewed. Pertinent imaging, labs and tests included in this note have been reviewed and interpreted independently by me.  Leone Mobley Mechele Collin, MD [facility] 11/17/2023 4:09 PM

## 2023-11-17 NOTE — Patient Instructions (Signed)
 Fibrotic NSIP --Normal PFTs  --Reviewed CT 11/07/23 with stable ILD, minimally progressed since 05/2020 --CONTINUE omeprazole 20 mg daily --Encourage regular aerobic exercise at home or [facility] --Plan for pulmonary function test in 1 year (10/2024)  Tobacco Abuse --Smoking 0-2 cigarettes daily --Uses nicotine toothpicks with improvement --Quit smoking

## 2024-03-08 DIAGNOSIS — E118 Type 2 diabetes mellitus with unspecified complications: Secondary | ICD-10-CM | POA: Diagnosis not present

## 2024-03-08 DIAGNOSIS — M21969 Unspecified acquired deformity of unspecified lower leg: Secondary | ICD-10-CM | POA: Diagnosis not present

## 2024-03-08 DIAGNOSIS — I10 Essential (primary) hypertension: Secondary | ICD-10-CM | POA: Diagnosis not present

## 2024-03-08 DIAGNOSIS — M5030 Other cervical disc degeneration, unspecified cervical region: Secondary | ICD-10-CM | POA: Diagnosis not present

## 2024-03-08 DIAGNOSIS — J8410 Pulmonary fibrosis, unspecified: Secondary | ICD-10-CM | POA: Diagnosis not present

## 2024-03-08 DIAGNOSIS — E669 Obesity, unspecified: Secondary | ICD-10-CM | POA: Diagnosis not present

## 2024-03-08 DIAGNOSIS — I2510 Atherosclerotic heart disease of native coronary artery without angina pectoris: Secondary | ICD-10-CM | POA: Diagnosis not present

## 2024-03-08 DIAGNOSIS — F17200 Nicotine dependence, unspecified, uncomplicated: Secondary | ICD-10-CM | POA: Diagnosis not present

## 2024-03-08 DIAGNOSIS — E785 Hyperlipidemia, unspecified: Secondary | ICD-10-CM | POA: Diagnosis not present

## 2024-03-08 DIAGNOSIS — D126 Benign neoplasm of colon, unspecified: Secondary | ICD-10-CM | POA: Diagnosis not present

## 2024-03-08 DIAGNOSIS — M6530 Trigger finger, unspecified finger: Secondary | ICD-10-CM | POA: Diagnosis not present

## 2024-03-08 DIAGNOSIS — R0683 Snoring: Secondary | ICD-10-CM | POA: Diagnosis not present

## 2024-08-17 NOTE — Progress Notes (Signed)
 Julian Sullivan                                          MRN: [id number]   08/17/2024   The [facility] Quality Team Specialist reviewed this patient medical record for the purposes of chart review for care gap closure. The following were reviewed: chart review for care gap closure-kidney health evaluation for diabetes:eGFR  and uACR.    [facility] Quality Team

## 2024-11-21 ENCOUNTER — Ambulatory Visit (HOSPITAL_BASED_OUTPATIENT_CLINIC_OR_DEPARTMENT_OTHER): Admitting: Pulmonary Disease

## 2024-11-21 ENCOUNTER — Encounter (HOSPITAL_BASED_OUTPATIENT_CLINIC_OR_DEPARTMENT_OTHER)
